# Patient Record
Sex: Male | Born: 1945
Health system: Southern US, Community
[De-identification: ages and names within clinical notes are randomized; demographics above are authoritative.]

## PROBLEM LIST (undated history)

## (undated) DIAGNOSIS — E785 Hyperlipidemia, unspecified: Secondary | ICD-10-CM

## (undated) DIAGNOSIS — G459 Transient cerebral ischemic attack, unspecified: Secondary | ICD-10-CM

## (undated) DIAGNOSIS — C801 Malignant (primary) neoplasm, unspecified: Secondary | ICD-10-CM

## (undated) DIAGNOSIS — Z9889 Other specified postprocedural states: Secondary | ICD-10-CM

## (undated) DIAGNOSIS — K5790 Diverticulosis of intestine, part unspecified, without perforation or abscess without bleeding: Secondary | ICD-10-CM

## (undated) DIAGNOSIS — Z4509 Encounter for adjustment and management of other cardiac device: Secondary | ICD-10-CM

## (undated) DIAGNOSIS — D126 Benign neoplasm of colon, unspecified: Secondary | ICD-10-CM

## (undated) DIAGNOSIS — I998 Other disorder of circulatory system: Secondary | ICD-10-CM

## (undated) HISTORY — DX: Benign neoplasm of colon, unspecified: D12.6

## (undated) HISTORY — DX: Malignant (primary) neoplasm, unspecified: C80.1

## (undated) HISTORY — PX: COLONOSCOPY: SHX174

## (undated) HISTORY — DX: Hyperlipidemia, unspecified: E78.5

## (undated) HISTORY — DX: Diverticulosis of intestine, part unspecified, without perforation or abscess without bleeding: K57.90

## (undated) HISTORY — PX: TONSILLECTOMY: SUR1361

## (undated) HISTORY — DX: Other disorder of circulatory system: I99.8

## (undated) HISTORY — PX: POLYPECTOMY: SHX149

---
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
## 2009-05-02 ENCOUNTER — Encounter (INDEPENDENT_AMBULATORY_CARE_PROVIDER_SITE_OTHER): Payer: Self-pay | Admitting: *Deleted

## 2010-02-21 NOTE — Letter (Signed)
Summary: Colonoscopy Date Change Letter  [facility]  [address] [place], [place] [address]   Phone: [phone]  Fax: [phone]      May 02, 2009 MRN: [id number]   [facility] [address] [place], [place]  [address]   Dear Joseph Pitts,   Previously you were recommended to have a repeat colonoscopy around this time. Your chart was recently reviewed by Dr. Judie Petit T. Russella Dar of [facility]. Follow up colonoscopy is now recommended in April 2014. This revised recommendation is based on current, nationally recognized guidelines for colorectal cancer screening and polyp surveillance. These guidelines are endorsed by the American Cancer Society, [employer] on Colorectal Cancer as well as numerous other major medical organizations.  Please understand that our recommendation assumes that you do not have any new symptoms such as bleeding, a change in bowel habits, anemia, or significant abdominal discomfort. If you do have any concerning GI symptoms or want to discuss the guideline recommendations, please call to arrange an office visit at your earliest convenience. Otherwise we will keep you in our reminder system and contact you 1-2 months prior to the date listed above to schedule your next colonoscopy.  Thank you,  Judie Petit T. Russella Dar, M.D.  [facility] [phone]

## 2011-06-05 ENCOUNTER — Ambulatory Visit: Payer: Self-pay | Admitting: Emergency Medicine

## 2011-07-10 ENCOUNTER — Encounter: Payer: Self-pay | Admitting: Emergency Medicine

## 2011-07-10 ENCOUNTER — Ambulatory Visit (INDEPENDENT_AMBULATORY_CARE_PROVIDER_SITE_OTHER): Payer: Federal, State, Local not specified - PPO | Admitting: Emergency Medicine

## 2011-07-10 VITALS — BP 129/74 | HR 62 | Temp 98.3°F | Resp 16 | Ht 67.0 in | Wt 154.4 lb

## 2011-07-10 DIAGNOSIS — N529 Male erectile dysfunction, unspecified: Secondary | ICD-10-CM

## 2011-07-10 DIAGNOSIS — E785 Hyperlipidemia, unspecified: Secondary | ICD-10-CM

## 2011-07-10 MED ORDER — ROSUVASTATIN CALCIUM 10 MG PO TABS: 10.0000 mg | ORAL_TABLET | Freq: Every day | ORAL | Status: DC

## 2011-07-10 MED ORDER — TADALAFIL 20 MG PO TABS: 20.0000 mg | ORAL_TABLET | ORAL | Status: DC | PRN

## 2011-07-10 NOTE — Progress Notes (Signed)
  Subjective:    Patient ID: Joseph Pitts, male    DOB: 20-Jan-1946, 66 y.o.   MRN: [id number]  HPI patient here to followup his high cholesterol. He currently takes Crestor every other day. Her sizes regularly. he would like to change from Viagra to Cialis for financial reasons    Review of Systems     Objective:   Physical Exam  HEENT exam is unremarkable. Neck supple. Chest clear. Heart regular rate no murmurs to      Assessment & Plan:  Patient with hyperlipidemia. We'll check his lipid panel. Meds were refilled the have changed his Viagra to Cialis .

## 2011-07-11 LAB — COMPREHENSIVE METABOLIC PANEL
AST: 36 U/L (ref 0–37)
Albumin: 4.1 g/dL (ref 3.5–5.2)
BUN: 26 mg/dL — ABNORMAL HIGH (ref 6–23)
Calcium: 9.4 mg/dL (ref 8.4–10.5)
Chloride: 103 mEq/L (ref 96–112)
Glucose, Bld: 98 mg/dL (ref 70–99)
Potassium: 4.5 mEq/L (ref 3.5–5.3)
Sodium: 140 mEq/L (ref 135–145)
Total Protein: 6.5 g/dL (ref 6.0–8.3)

## 2011-07-11 LAB — LIPID PANEL
HDL: 46 mg/dL (ref >39)
LDL Cholesterol: 82 mg/dL (ref 0–99)
Triglycerides: 101 mg/dL (ref <150)

## 2011-12-13 ENCOUNTER — Telehealth: Payer: Self-pay

## 2011-12-13 NOTE — Telephone Encounter (Signed)
Please advise if okay to change to Cialis 5mg  daily? Patient states it is cheaper please advise. Have pended rx

## 2011-12-13 NOTE — Telephone Encounter (Signed)
Will need an ov to change to daily.

## 2011-12-13 NOTE — Telephone Encounter (Signed)
PT REQUESTS CIALIS RX ORIGINIALLY WRITTEN FOR 20MG  TAKING 1/2 TABLET   STATES HE CAN GET THE 5MG  1/2 PRICE HE REQUESTS RX FOR 5MG  2 TABS AS PRESCRIBED

## 2011-12-14 NOTE — Telephone Encounter (Signed)
Spoke with patient and let him know that he will need to come into office for visit before prescription can be changed.  Patient will try and come in this weekend.  He stated that he cuts the 20mg  in half and it is cheaper for him to purchase 5mg  tablets.

## 2011-12-14 NOTE — Telephone Encounter (Signed)
I have sent the patients medication to the pharmacy.  I know when I spoke with him that I told him I needed to talk with Dr Cleta Alberts but after I talked with him I felt like his request was warranted and I sent it to the pharmacy.  I have pended the meds because the pharmacy in the system is [facility] but I think I remember that the patient said he was going to [facility] and I wanted to make sure that the med went to the same pharmacy.

## 2011-12-14 NOTE — Telephone Encounter (Signed)
LMOM to call back

## 2011-12-15 MED ORDER — TADALAFIL 5 MG PO TABS: 10.0000 mg | ORAL_TABLET | Freq: Every day | ORAL | Status: DC | PRN

## 2011-12-15 NOTE — Telephone Encounter (Signed)
patient notified and voiced understanding. He does want them sent to [facility].

## 2012-05-01 ENCOUNTER — Encounter: Payer: Self-pay | Admitting: Gastroenterology

## 2012-07-22 ENCOUNTER — Encounter: Payer: Self-pay | Admitting: Emergency Medicine

## 2012-07-22 ENCOUNTER — Ambulatory Visit (INDEPENDENT_AMBULATORY_CARE_PROVIDER_SITE_OTHER): Payer: Federal, State, Local not specified - PPO | Admitting: Emergency Medicine

## 2012-07-22 VITALS — BP 130/82 | HR 58 | Temp 98.1°F | Resp 16 | Ht 67.0 in | Wt 154.0 lb

## 2012-07-22 DIAGNOSIS — E785 Hyperlipidemia, unspecified: Secondary | ICD-10-CM

## 2012-07-22 DIAGNOSIS — N529 Male erectile dysfunction, unspecified: Secondary | ICD-10-CM

## 2012-07-22 LAB — LIPID PANEL
Cholesterol: 159 mg/dL (ref 0–200)
HDL: 55 mg/dL (ref >39)
Total CHOL/HDL Ratio: 2.9 Ratio
VLDL: 14 mg/dL (ref 0–40)

## 2012-07-22 NOTE — Progress Notes (Signed)
  Subjective:    Patient ID: Joseph Pitts, male    DOB: [birth year], 67 y.o.   MRN: [id number]  HPI patient enters for followup. He recently broke up with his girlfriend and this has been an issue. He has recently signed on to online dating. He continues to take his Crestor and takes it 3 days a week. He does have a significant family history of coronary artery disease. He does not have any history of chest pain shortness of breath or any other symptoms at the present time. He does have some urinary problems and follows up with the urologist for this.    Review of Systems     Objective:   Physical Exam HEENT exam is unremarkable. 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 I encouraged him to pursue online dating. Lipid panel was done today to followup on his hyperlipidemia

## 2012-08-14 ENCOUNTER — Other Ambulatory Visit: Payer: Self-pay | Admitting: Emergency Medicine

## 2012-12-24 ENCOUNTER — Telehealth: Payer: Self-pay | Admitting: Radiology

## 2012-12-24 NOTE — Telephone Encounter (Signed)
error 

## 2013-02-03 ENCOUNTER — Ambulatory Visit (INDEPENDENT_AMBULATORY_CARE_PROVIDER_SITE_OTHER): Payer: Federal, State, Local not specified - PPO | Admitting: Emergency Medicine

## 2013-02-03 ENCOUNTER — Encounter: Payer: Self-pay | Admitting: Emergency Medicine

## 2013-02-03 DIAGNOSIS — Z0000 Encounter for general adult medical examination without abnormal findings: Secondary | ICD-10-CM

## 2013-02-03 DIAGNOSIS — N529 Male erectile dysfunction, unspecified: Secondary | ICD-10-CM

## 2013-02-03 DIAGNOSIS — Z1211 Encounter for screening for malignant neoplasm of colon: Secondary | ICD-10-CM

## 2013-02-03 DIAGNOSIS — Z125 Encounter for screening for malignant neoplasm of prostate: Secondary | ICD-10-CM

## 2013-02-03 DIAGNOSIS — H3560 Retinal hemorrhage, unspecified eye: Secondary | ICD-10-CM

## 2013-02-03 DIAGNOSIS — H3562 Retinal hemorrhage, left eye: Secondary | ICD-10-CM | POA: Insufficient documentation

## 2013-02-03 DIAGNOSIS — I440 Atrioventricular block, first degree: Secondary | ICD-10-CM

## 2013-02-03 DIAGNOSIS — E785 Hyperlipidemia, unspecified: Secondary | ICD-10-CM

## 2013-02-03 DIAGNOSIS — K4090 Unilateral inguinal hernia, without obstruction or gangrene, not specified as recurrent: Secondary | ICD-10-CM

## 2013-02-03 LAB — COMPLETE METABOLIC PANEL WITH GFR
ALK PHOS: 102 U/L (ref 39–117)
ALT: 14 U/L (ref 0–53)
AST: 23 U/L (ref 0–37)
Albumin: 4.3 g/dL (ref 3.5–5.2)
BILIRUBIN TOTAL: 0.7 mg/dL (ref 0.3–1.2)
BUN: 26 mg/dL — ABNORMAL HIGH (ref 6–23)
CO2: 29 mEq/L (ref 19–32)
Calcium: 9.3 mg/dL (ref 8.4–10.5)
Chloride: 101 mEq/L (ref 96–112)
Creat: 0.99 mg/dL (ref 0.50–1.35)
GFR, EST NON AFRICAN AMERICAN: 79 mL/min
GFR, Est African American: >89 mL/min
Glucose, Bld: 93 mg/dL (ref 70–99)
POTASSIUM: 4.2 meq/L (ref 3.5–5.3)
SODIUM: 138 meq/L (ref 135–145)
TOTAL PROTEIN: 6.5 g/dL (ref 6.0–8.3)

## 2013-02-03 LAB — CBC WITH DIFFERENTIAL/PLATELET
BASOS ABS: 0 10*3/uL (ref 0.0–0.1)
BASOS PCT: 0 % (ref 0–1)
Eosinophils Absolute: 0.1 10*3/uL (ref 0.0–0.7)
Eosinophils Relative: 2 % (ref 0–5)
HCT: 46.7 % (ref 39.0–52.0)
Hemoglobin: 16.2 g/dL (ref 13.0–17.0)
Lymphocytes Relative: 29 % (ref 12–46)
Lymphs Abs: 2.1 10*3/uL (ref 0.7–4.0)
MCH: 31.1 pg (ref 26.0–34.0)
MCHC: 34.7 g/dL (ref 30.0–36.0)
MCV: 89.6 fL (ref 78.0–100.0)
Monocytes Absolute: 0.5 10*3/uL (ref 0.1–1.0)
Monocytes Relative: 7 % (ref 3–12)
NEUTROS ABS: 4.6 10*3/uL (ref 1.7–7.7)
NEUTROS PCT: 62 % (ref 43–77)
PLATELETS: 285 10*3/uL (ref 150–400)
RBC: 5.21 MIL/uL (ref 4.22–5.81)
RDW: 13.2 % (ref 11.5–15.5)
WBC: 7.3 10*3/uL (ref 4.0–10.5)

## 2013-02-03 LAB — POCT URINALYSIS DIPSTICK
BILIRUBIN UA: NEGATIVE
Blood, UA: NEGATIVE
Glucose, UA: NEGATIVE
Ketones, UA: NEGATIVE
Leukocytes, UA: NEGATIVE
Nitrite, UA: NEGATIVE
PH UA: 5.5
Protein, UA: NEGATIVE
Spec Grav, UA: >=1.03
Urobilinogen, UA: 0.2

## 2013-02-03 LAB — LIPID PANEL
Cholesterol: 204 mg/dL — ABNORMAL HIGH (ref 0–200)
HDL: 49 mg/dL (ref >39)
LDL CALC: 136 mg/dL — AB (ref 0–99)
Total CHOL/HDL Ratio: 4.2 Ratio
Triglycerides: 94 mg/dL (ref <150)
VLDL: 19 mg/dL (ref 0–40)

## 2013-02-03 MED ORDER — ROSUVASTATIN CALCIUM 10 MG PO TABS: ORAL_TABLET | ORAL | Status: DC

## 2013-02-03 MED ORDER — TADALAFIL 5 MG PO TABS: 10.0000 mg | ORAL_TABLET | Freq: Every day | ORAL | Status: DC | PRN

## 2013-02-03 NOTE — Progress Notes (Deleted)
   Subjective:    Patient ID: Joseph Pitts, male    DOB: [birth year], 68 y.o.   MRN: [id number]  HPI    Review of Systems  Constitutional: Negative.   HENT: Negative.   Eyes: Positive for visual disturbance.  Respiratory: Negative.   Cardiovascular: Negative.   Gastrointestinal: Negative.   Endocrine: Negative.   Genitourinary: Negative.   Musculoskeletal: Negative.   Skin: Negative.   Allergic/Immunologic: Negative.   Neurological: Negative.   Hematological: Negative.   Psychiatric/Behavioral: Negative.        Objective:   Physical Exam        Assessment & Plan:

## 2013-02-03 NOTE — Progress Notes (Signed)
Subjective:    Patient ID: Joseph Pitts, male    DOB: Jun 11, 1945, 68 y.o.   MRN: [id number]  HPI This chart was scribed for Remo Lipps Kaylaann Mountz-MD, by Lovena Le Day, Scribe. This patient was seen in room 22 and the patient's care was started at 8:38 AM.  HPI Comments: Joseph Pitts is a 68 y.o. male who presents to the [facility] for annual physical exam. He reports has been exercising regularly and has been doing so for many years, he usually walks for exercise. Not taking Crestor medicine for the past 3 weeks. He was taking it 3 days a week. He takes Cialis when he needs it. He takes ASA almost every day.  He had a problem arise lately w/his left retina and gets regular shots for this problem. He states has had 2 shots so far which have seemed to help.  He also states has 2 hernias and notices them when he is working out at [employer] but states that they do not bother him. He recently saw the urologist and needs PSA sent to the urologist.  He has had the flu shot, shingles and PNA this year. His tetanus is also UTD.   Patient Active Problem List   Diagnosis Date Noted  . Other and unspecified hyperlipidemia 07/22/2012  . Erectile dysfunction 07/22/2012   History reviewed. No pertinent past surgical history.  Family History  Problem Relation Age of Onset  . Heart disease Mother   . Cancer Father     History   Social History  . Marital Status: Married    Spouse Name: N/A    Number of Children: N/A  . Years of Education: N/A   Occupational History  . Not on file.   Social History Main Topics  . Smoking status: Former Research scientist (life sciences)  . Smokeless tobacco: Not on file  . Alcohol Use: Yes  . Drug Use: No  . Sexual Activity: Not on file   Other Topics Concern  . Not on file   Social History Narrative  . No narrative on file   Allergies  Allergen Reactions  . Sulfa Antibiotics     unknown   Results for orders placed in visit on 07/22/12  LIPID PANEL   Result Value Range   Cholesterol 159  0 - 200 mg/dL   Triglycerides 69  <150 mg/dL   HDL 55  >39 mg/dL   Total CHOL/HDL Ratio 2.9     VLDL 14  0 - 40 mg/dL   LDL Cholesterol 90  0 - 99 mg/dL   Review of Systems  Constitutional: Negative for fever and chills.  Respiratory: Negative for shortness of breath.   Cardiovascular: Negative for chest pain.  Gastrointestinal: Negative for abdominal pain.  Musculoskeletal: Negative for back pain.      Objective:   Physical Exam Physical Exam  Nursing note and vitals reviewed. Constitutional: Patient is oriented to person, place, and time. Patient appears well-developed and well-nourished. No distress.  HENT: Moist mucous membranes, posterior oropharynx is clear Head: Normocephalic and atraumatic.  Neck: Neck supple. No tracheal deviation present.  Cardiovascular: Normal rate, regular rhythm and normal heart sounds. Normal DP pulses bilaterally. No murmur heard. Pulmonary/Chest: Effort normal and breath sounds normal. No respiratory distress. Patient has no wheezes. Patient has no rales.  Musculoskeletal: Normal range of motion. Small right inguinal hernia that is reducible. Large left inguinal hernia that is reducible.  Neurological: Patient is alert and oriented to person, place,  and time.  Skin: Skin is warm and dry.  Psychiatric: Patient has a normal mood and affect. Patient's behavior is normal.  ABD patient has bilateral inguinal hernias Triage Vitals: There were no vitals taken for this visit.  COORDINATION OF CARE: At 831 AM Discussed treatment plan with patient which includes referral for colonoscopy. Patient agrees.      Assessment & Plan:  Patient is to continue his current medications. He is to decide if he wants to stay on Crestor. He's been off of that for the last 3 weeks. He will discuss whether he wants to be on baby aspirin with his eyes surgeon Dr.Rankin he will continue Cialis 2 tablets prior to intercourse. He is going  to make his own appointment to see the GI specialist for colonoscopy. He will make his own appointment to see a surgeon about repair of his inguinal hernias.

## 2013-02-04 LAB — PSA, MEDICARE: PSA: 3.38 ng/mL (ref <=4.00)

## 2013-02-05 ENCOUNTER — Telehealth: Payer: Self-pay | Admitting: *Deleted

## 2013-02-05 NOTE — Telephone Encounter (Signed)
Dr Everlene Farrier spoke to pt about PSA lab results. Pt is in a new relationship and is very sexually active. Pt agrees to rtc in 2 months for a recheck.

## 2013-02-25 ENCOUNTER — Telehealth: Payer: Self-pay

## 2013-03-02 ENCOUNTER — Ambulatory Visit (INDEPENDENT_AMBULATORY_CARE_PROVIDER_SITE_OTHER): Payer: Federal, State, Local not specified - PPO | Admitting: Emergency Medicine

## 2013-03-02 VITALS — BP 122/78 | HR 58 | Temp 98.0°F | Resp 17 | Ht 67.5 in | Wt 163.0 lb

## 2013-03-02 DIAGNOSIS — Z202 Contact with and (suspected) exposure to infections with a predominantly sexual mode of transmission: Secondary | ICD-10-CM

## 2013-03-02 LAB — HEPATITIS C ANTIBODY: HCV Ab: NEGATIVE

## 2013-03-02 LAB — HIV ANTIBODY (ROUTINE TESTING W REFLEX): HIV: NONREACTIVE

## 2013-03-02 NOTE — Progress Notes (Signed)
   Subjective:    Patient ID: Joseph Pitts, male    DOB: [birth year], 68 y.o.   MRN: [id number] This chart was scribed for Darlyne Russian, MD by Anastasia Pall, ED Scribe. This patient was seen in room 02 and the patient's care was started at 9:30 AM.  Chief Complaint  Patient presents with  . patient is requesting a std blood test and A PSA    HPI Joseph Pitts is a 68 y.o. male He presents to the [facility] for STD blood check and A PSA, per dating his partner and being sexually active.  He denies urinary and GU symptoms and any other symptoms.   PCP - No PCP Per Patient  Patient Active Problem List   Diagnosis Date Noted  . Inguinal hernia 02/03/2013  . First degree heart block 02/03/2013  . Retinal hemorrhage of left eye 02/03/2013  . Other and unspecified hyperlipidemia 07/22/2012  . Erectile dysfunction 07/22/2012   No past medical history on file. No past surgical history on file. Allergies  Allergen Reactions  . Sulfa Antibiotics     unknown   Prior to Admission medications   Medication Sig Start Date End Date Taking? Authorizing Provider  aspirin 81 MG tablet Take 81 mg by mouth daily.   Yes Historical Provider, MD  Multiple Vitamin (MULTIVITAMIN) tablet Take 1 tablet by mouth daily.   Yes Historical Provider, MD  rosuvastatin (CRESTOR) 10 MG tablet TAKE 1 TABLET (10 MG TOTAL) BY MOUTH DAILY. 02/03/13  Yes Darlyne Russian, MD  tadalafil (CIALIS) 5 MG tablet Take 2 tablets (10 mg total) by mouth daily as needed for erectile dysfunction. 02/03/13  Yes Darlyne Russian, MD    Review of Systems  Gastrointestinal: Negative.   Genitourinary: Negative.   Skin: Negative.   All other systems reviewed and are negative.      Objective:   Physical Exam Nursing note and vitals reviewed. Constitutional: Pt is oriented to person, place, and time. Pt appears well-developed and well-nourished. No distress.  HENT:  Head: Normocephalic and atraumatic.  Eyes: EOM are normal. Pupils  are equal, round, and reactive to light.  Neck: Neck supple. No tracheal deviation present.  Cardiovascular: Normal rate. Pulmonary/Chest: Effort normal. Abdominal:  Musculoskeletal: Normal range of motion.  Neurological: Pt is alert and oriented to person, place, and time.  Skin: Skin is warm and dry.  Psychiatric: Pt has a normal mood and affect. Pt's behavior is normal.   BP 122/78  Pulse 58  Temp(Src) 98 F (36.7 C) (Oral)  Resp 17  Ht 5' 7.5" (1.715 m)  Wt 163 lb (73.936 kg)  BMI 25.14 kg/m2  SpO2 97%      Assessment & Plan:   Patient's exam is normal. He is in a new relationship. STD testing will be performed    I personally performed the services described in this documentation, which was scribed in my presence. The recorded information has been reviewed and is accurate.

## 2013-03-03 LAB — GC/CHLAMYDIA PROBE AMP
CT Probe RNA: NEGATIVE
GC Probe RNA: NEGATIVE

## 2013-03-12 ENCOUNTER — Telehealth: Payer: Self-pay

## 2013-03-12 DIAGNOSIS — R972 Elevated prostate specific antigen [PSA]: Secondary | ICD-10-CM

## 2013-03-12 DIAGNOSIS — Z113 Encounter for screening for infections with a predominantly sexual mode of transmission: Secondary | ICD-10-CM

## 2013-03-12 NOTE — Telephone Encounter (Signed)
Pt notified and future orders placed.

## 2013-03-12 NOTE — Telephone Encounter (Signed)
I am not sure why they were canceled. Please put in a future orders and at that time draw a PSA to followup on an elevated PSA and add an HSV 1 and 2 IgG

## 2013-03-12 NOTE — Telephone Encounter (Signed)
Dr. Everlene Farrier, pt wants to know when he should have his PSA checked again. Also it looks like we were going to do a HSV test on him but all of the ones in [facility] were cancelled. Do you know anything about that? Please advise. Thanks

## 2013-03-12 NOTE — Telephone Encounter (Signed)
Patient states he never received the results of his STD/PSA exam.  [phone] (ok to leave VM)

## 2013-03-26 ENCOUNTER — Other Ambulatory Visit (INDEPENDENT_AMBULATORY_CARE_PROVIDER_SITE_OTHER): Payer: Federal, State, Local not specified - PPO

## 2013-03-26 DIAGNOSIS — R972 Elevated prostate specific antigen [PSA]: Secondary | ICD-10-CM

## 2013-03-26 DIAGNOSIS — Z113 Encounter for screening for infections with a predominantly sexual mode of transmission: Secondary | ICD-10-CM

## 2013-03-26 LAB — PSA: PSA: 2.11 ng/mL (ref <=4.00)

## 2013-03-26 NOTE — Progress Notes (Unsigned)
Patient here for labs only. 

## 2013-03-27 LAB — HSV(HERPES SIMPLEX VRS) I + II AB-IGG
HSV 1 GLYCOPROTEIN G AB, IGG: 0.08 IV
HSV 2 Glycoprotein G Ab, IgG: 5.24 IV — ABNORMAL HIGH

## 2013-05-01 ENCOUNTER — Telehealth: Payer: Self-pay

## 2013-05-01 NOTE — Telephone Encounter (Signed)
PA denied for cialis because pt's plan does not cover any medications for the tx of ED. Notified pharm.

## 2013-09-19 ENCOUNTER — Ambulatory Visit: Payer: Self-pay

## 2013-09-19 ENCOUNTER — Ambulatory Visit (INDEPENDENT_AMBULATORY_CARE_PROVIDER_SITE_OTHER): Payer: Federal, State, Local not specified - PPO

## 2013-09-19 ENCOUNTER — Ambulatory Visit (INDEPENDENT_AMBULATORY_CARE_PROVIDER_SITE_OTHER): Payer: Federal, State, Local not specified - PPO | Admitting: Family Medicine

## 2013-09-19 VITALS — BP 136/82 | HR 59 | Temp 98.0°F | Resp 15 | Ht 66.5 in | Wt 157.4 lb

## 2013-09-19 DIAGNOSIS — IMO0002 Reserved for concepts with insufficient information to code with codable children: Secondary | ICD-10-CM

## 2013-09-19 DIAGNOSIS — M25519 Pain in unspecified shoulder: Secondary | ICD-10-CM

## 2013-09-19 DIAGNOSIS — S9030XA Contusion of unspecified foot, initial encounter: Secondary | ICD-10-CM

## 2013-09-19 DIAGNOSIS — S9032XA Contusion of left foot, initial encounter: Secondary | ICD-10-CM

## 2013-09-19 DIAGNOSIS — S60512A Abrasion of left hand, initial encounter: Secondary | ICD-10-CM

## 2013-09-19 DIAGNOSIS — M25512 Pain in left shoulder: Secondary | ICD-10-CM
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
## 2013-09-19 NOTE — Patient Instructions (Signed)
Shoulder Separation You have a shoulder separation (acromioclavicular separation). This occurs when an injury stretches or tears the ligaments that connect the top of the shoulder blade to the collar bone. Injury causes these bones to separate. A sling or splint may be applied to limit your range of motion. HOME CARE INSTRUCTIONS   Apply ice to the injury for 15-20 minutes each hour while awake for the first 2 days. Put the ice in a plastic bag. Place a towel between the bag of ice and your skin.  Avoid activities that increase pain.  Wear your sling or splint for as long as directed by your caregiver. If you remove the sling to dress or bathe, be sure your arm remains in the same position as when the sling is on. Do not lift your arm.  The splint must be tightened by another person every day. Tighten it enough to keep the shoulders held back. Allow enough room to place the index finger between the body and strap. Loosen the splint immediately if you feel numbness or tingling in your hands.  Only take over-the-counter or prescription medicines for pain, discomfort, or fever as directed by your caregiver.  If this is a severe injury, you may need a referral to a specialist. It is very important to keep any follow-up appointments in order to avoid any type of permanent shoulder disability or chronic pain problems. SEEK MEDICAL CARE IF:  Pain or swelling to the injury site increases and is not relieved with medications. SEEK IMMEDIATE MEDICAL CARE IF:  Your arm becomes numb, pale, cold, or there are abnormal feelings (sensations) shooting into your fingertips. Document Released: 10/18/2004 Document Revised: 05/25/2013 Document Reviewed: 08/20/2006 [facility] Patient Information 2015 [place], [place]. This information is not intended to replace advice given to you by your health care provider. Make sure you discuss any questions you have with your health care provider.

## 2013-09-19 NOTE — Addendum Note (Signed)
Addended by: Gustavus Bryant on: 09/19/2013 01:43 PM   Modules accepted: Orders

## 2013-10-29 ENCOUNTER — Ambulatory Visit (INDEPENDENT_AMBULATORY_CARE_PROVIDER_SITE_OTHER): Payer: BC Managed Care – PPO | Admitting: Sports Medicine

## 2013-10-29 ENCOUNTER — Encounter: Payer: Self-pay | Admitting: Sports Medicine

## 2013-10-29 VITALS — BP 128/83 | HR 67 | Ht 68.0 in | Wt 155.0 lb

## 2013-10-29 DIAGNOSIS — M25512 Pain in left shoulder: Secondary | ICD-10-CM | POA: Diagnosis not present

## 2013-10-29 NOTE — Assessment & Plan Note (Addendum)
A.c. joint separation observed on films and ultrasound. Pain most likely associated with this but also could be caused by spur observed in supraspinatus on ultrasound. - Given exercises.  - Encourage lots of repetitions with low weights  - 6 Weeks before doing any overhead exercises - RTC at 6 weeks when necessary; that time can rescan the a.c. joint to see if the effusion has resolved
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
## 2013-12-10 ENCOUNTER — Ambulatory Visit: Payer: BC Managed Care – PPO | Admitting: Sports Medicine

## 2013-12-29 ENCOUNTER — Telehealth: Payer: Self-pay | Admitting: Family Medicine

## 2013-12-29 NOTE — Telephone Encounter (Signed)
Patient received his flu shot at work on 11/05/13

## 2014-01-22 DIAGNOSIS — D126 Benign neoplasm of colon, unspecified: Secondary | ICD-10-CM

## 2014-01-22 HISTORY — DX: Benign neoplasm of colon, unspecified: D12.6

## 2014-02-09 ENCOUNTER — Ambulatory Visit (INDEPENDENT_AMBULATORY_CARE_PROVIDER_SITE_OTHER): Payer: Federal, State, Local not specified - PPO | Admitting: Emergency Medicine

## 2014-02-09 ENCOUNTER — Encounter: Payer: Self-pay | Admitting: Emergency Medicine

## 2014-02-09 VITALS — BP 120/80 | HR 54 | Temp 97.9°F | Resp 16 | Ht 67.75 in | Wt 156.8 lb

## 2014-02-09 DIAGNOSIS — R0982 Postnasal drip: Secondary | ICD-10-CM

## 2014-02-09 DIAGNOSIS — Z0000 Encounter for general adult medical examination without abnormal findings: Secondary | ICD-10-CM

## 2014-02-09 DIAGNOSIS — Z23 Encounter for immunization: Secondary | ICD-10-CM

## 2014-02-09 DIAGNOSIS — Z125 Encounter for screening for malignant neoplasm of prostate: Secondary | ICD-10-CM

## 2014-02-09 DIAGNOSIS — Z136 Encounter for screening for cardiovascular disorders: Secondary | ICD-10-CM

## 2014-02-09 DIAGNOSIS — E785 Hyperlipidemia, unspecified: Secondary | ICD-10-CM

## 2014-02-09 DIAGNOSIS — Z1159 Encounter for screening for other viral diseases: Secondary | ICD-10-CM

## 2014-02-09 DIAGNOSIS — Z1211 Encounter for screening for malignant neoplasm of colon: Secondary | ICD-10-CM

## 2014-02-09 DIAGNOSIS — I440 Atrioventricular block, first degree: Secondary | ICD-10-CM

## 2014-02-09 LAB — POCT URINALYSIS DIPSTICK
BILIRUBIN UA: NEGATIVE
Blood, UA: NEGATIVE
Glucose, UA: NEGATIVE
KETONES UA: NEGATIVE
Leukocytes, UA: NEGATIVE
Nitrite, UA: NEGATIVE
Protein, UA: NEGATIVE
SPEC GRAV UA: 1.015
Urobilinogen, UA: 0.2
pH, UA: 6.5

## 2014-02-09 LAB — COMPLETE METABOLIC PANEL WITH GFR
ALBUMIN: 4.2 g/dL (ref 3.5–5.2)
ALT: 16 U/L (ref 0–53)
AST: 26 U/L (ref 0–37)
Alkaline Phosphatase: 94 U/L (ref 39–117)
BUN: 21 mg/dL (ref 6–23)
CHLORIDE: 102 meq/L (ref 96–112)
CO2: 27 mEq/L (ref 19–32)
Calcium: 9.2 mg/dL (ref 8.4–10.5)
Creat: 1.04 mg/dL (ref 0.50–1.35)
GFR, Est African American: 85 mL/min
GFR, Est Non African American: 74 mL/min
GLUCOSE: 91 mg/dL (ref 70–99)
POTASSIUM: 4.5 meq/L (ref 3.5–5.3)
Sodium: 138 mEq/L (ref 135–145)
TOTAL PROTEIN: 6.6 g/dL (ref 6.0–8.3)
Total Bilirubin: 0.6 mg/dL (ref 0.2–1.2)

## 2014-02-09 LAB — LIPID PANEL
Cholesterol: 182 mg/dL (ref 0–200)
HDL: 53 mg/dL (ref >39)
LDL Cholesterol: 115 mg/dL — ABNORMAL HIGH (ref 0–99)
TRIGLYCERIDES: 69 mg/dL (ref <150)
Total CHOL/HDL Ratio: 3.4 Ratio
VLDL: 14 mg/dL (ref 0–40)

## 2014-02-09 LAB — CBC WITH DIFFERENTIAL/PLATELET
BASOS PCT: 1 % (ref 0–1)
Basophils Absolute: 0.1 10*3/uL (ref 0.0–0.1)
EOS PCT: 2 % (ref 0–5)
Eosinophils Absolute: 0.1 10*3/uL (ref 0.0–0.7)
HCT: 40.2 % (ref 39.0–52.0)
Hemoglobin: 13.8 g/dL (ref 13.0–17.0)
Lymphocytes Relative: 34 % (ref 12–46)
Lymphs Abs: 1.9 10*3/uL (ref 0.7–4.0)
MCH: 30 pg (ref 26.0–34.0)
MCHC: 34.3 g/dL (ref 30.0–36.0)
MCV: 87.4 fL (ref 78.0–100.0)
MONO ABS: 0.3 10*3/uL (ref 0.1–1.0)
MONOS PCT: 6 % (ref 3–12)
MPV: 9.6 fL (ref 8.6–12.4)
Neutro Abs: 3.2 10*3/uL (ref 1.7–7.7)
Neutrophils Relative %: 57 % (ref 43–77)
Platelets: 239 10*3/uL (ref 150–400)
RBC: 4.6 MIL/uL (ref 4.22–5.81)
RDW: 13.6 % (ref 11.5–15.5)
WBC: 5.6 10*3/uL (ref 4.0–10.5)

## 2014-02-09 LAB — HEPATITIS C ANTIBODY: HCV Ab: NEGATIVE

## 2014-02-09 MED ORDER — ATORVASTATIN CALCIUM 40 MG PO TABS: 40.0000 mg | ORAL_TABLET | Freq: Every day | ORAL | Status: DC

## 2014-02-09 NOTE — Progress Notes (Signed)
Subjective:  This chart was scribed for Arlyss Queen, MD by Donato Schultz, Medical Scribe. This patient was seen in Room 21 and the patient's care was started at 8:50 AM.   Patient ID: Joseph Pitts, male    DOB: 04/08/1945, 69 y.o.   MRN: [id number]  HPI HPI Comments: Joseph Pitts is a 69 y.o. male who presents to the [facility] for an annual exam.  He is complaining of rhinorrhea and hoarseness while eating and brushing his teeth.  He does not experience heartburn or gas after eating.  He was prescribed a nasal spray at his last visit to be used for his symptoms and experienced no relief.  Nothing sticks in his throat when he eats.    He would like to change his Crestor prescription due to a change in his insurance.  He has not taken any other blood pressure medications.  He takes Crestor three times weekly.    He is also wanting an abdominal aortic aneurysm screening.  He smoked from the time he was 69 years old to the time he was 24 years told and then for a year in the 58s.  He does not have a family history of abdominal aortic aneurysm.  He has not received a colonoscopy lately.  His GI doctor is Dr. Fuller Plan.  Patient Active Problem List   Diagnosis Date Noted  . Left shoulder pain 10/29/2013  . Inguinal hernia 02/03/2013  . First degree heart block 02/03/2013  . Retinal hemorrhage of left eye 02/03/2013  . Other and unspecified hyperlipidemia 07/22/2012  . Erectile dysfunction 07/22/2012   No past medical history on file. No past surgical history on file. Allergies  Allergen Reactions  . Sulfa Antibiotics     unknown   Prior to Admission medications   Medication Sig Start Date End Date Taking? Authorizing Provider  aspirin 81 MG tablet Take 81 mg by mouth daily.    Historical Provider, MD  ibuprofen (ADVIL,MOTRIN) 800 MG tablet Take 800 mg by mouth every 8 (eight) hours as needed.    Historical Provider, MD  Multiple Vitamin (MULTIVITAMIN) tablet  Take 1 tablet by mouth daily.    Historical Provider, MD  Ranibizumab (LUCENTIS IO) Inject into the eye. Every 6 weeks, last dose 08/12/13, unsure of dose    Historical Provider, MD  rosuvastatin (CRESTOR) 10 MG tablet TAKE 1 TABLET (10 MG TOTAL) BY MOUTH DAILY. 02/03/13   Darlyne Russian, MD  tadalafil (CIALIS) 5 MG tablet Take 2 tablets (10 mg total) by mouth daily as needed for erectile dysfunction. 02/03/13   Darlyne Russian, MD   History   Social History  . Marital Status: Divorced    Spouse Name: N/A    Number of Children: N/A  . Years of Education: N/A   Occupational History  . Not on file.   Social History Main Topics  . Smoking status: Former Research scientist (life sciences)  . Smokeless tobacco: Not on file  . Alcohol Use: Yes  . Drug Use: No  . Sexual Activity: Not on file   Other Topics Concern  . Not on file   Social History Narrative     Review of Systems  HENT: Positive for postnasal drip, rhinorrhea and voice change.   All other systems reviewed and are negative.   Objective:  Physical Exam  Constitutional: He is oriented to person, place, and time. He appears well-developed and well-nourished.  HENT:  Head: Normocephalic and atraumatic.  Right  Ear: External ear normal.  Left Ear: External ear normal.  Mouth/Throat: Oropharynx is clear and moist. No oropharyngeal exudate.  Eyes: Conjunctivae and EOM are normal. Pupils are equal, round, and reactive to light. Right eye exhibits no discharge. Left eye exhibits no discharge. No scleral icterus.  Neck: Normal range of motion. Neck supple. No thyromegaly present.  Cardiovascular: Normal rate, regular rhythm and normal heart sounds.  Exam reveals no gallop and no friction rub.   No murmur heard. Pulmonary/Chest: Effort normal and breath sounds normal. No respiratory distress. He has no wheezes. He has no rales. He exhibits no tenderness.  Pectus deformity in the chest.  Abdominal: Soft. There is no tenderness.  Genitourinary:  Patient  due to see urologist in 2 weeks.  Rectal deferred.   Musculoskeletal: Normal range of motion.  Lymphadenopathy:    He has no cervical adenopathy.  Neurological: He is alert and oriented to person, place, and time.  Skin: Skin is warm and dry.  Psychiatric: He has a normal mood and affect. His behavior is normal.  Nursing note and vitals reviewed.  EKG shows sinus bradycardia with first-degree heart block.  BP 120/80 mmHg  Pulse 54  Temp(Src) 97.9 F (36.6 C) (Oral)  Resp 16  Ht 5' 7.75" (1.721 m)  Wt 156 lb 12.8 oz (71.124 kg)  BMI 24.01 kg/m2  SpO2 99% Assessment & Plan:  Patient has symptoms of post-nasal drip.  Referral to ENT.  Patient over 62 with a history of smoking.  Will refer for screening for abdominal aortic aneurysm.  He is due to see the urologist in 2 weeks so his exam was deferred. He will take home a heme assure. We'll treat with Lipitor 40 mg 3 times a week as a change from Crestor because of insurance issues. Referral made back to Dr. Fuller Plan so he can have his GI screening. I personally performed the services described in this documentation, which was scribed in my presence. The recorded information has been reviewed and is accurate.

## 2014-02-09 NOTE — Progress Notes (Signed)
Subjective:  This chart was scribed for Joseph Queen, MD by Donato Schultz, Medical Scribe. This patient was seen in Room 21 and the patient's care was started at 8:50 AM.   Patient ID: Ok Joseph Pitts, male    DOB: 04-21-1945, 69 y.o.   MRN: [id number]  HPI HPI Comments: Joseph Pitts is a 69 y.o. male who presents to the [facility] for an annual exam.  He is complaining of rhinorrhea and hoarseness while eating and brushing his teeth.  He does not experience heartburn or gas after eating.  He was prescribed a nasal spray at his last visit to be used for his symptoms and experienced no relief.  Nothing sticks in his throat when he eats.    He would like to change his Crestor prescription due to a change in his insurance.  He has not taken any other blood pressure medications.  He takes Crestor three times weekly.    He is also wanting an abdominal aortic aneurysm screening.  He smoked from the time he was 69 years old to the time he was 24 years told and then for a year in the 30s.  He does not have a family history of abdominal aortic aneurysm.  He has not received a colonoscopy lately.  His GI doctor is Dr. Fuller Plan.  Patient Active Problem List   Diagnosis Date Noted   Left shoulder pain 10/29/2013   Inguinal hernia 02/03/2013   First degree heart block 02/03/2013   Retinal hemorrhage of left eye 02/03/2013   Other and unspecified hyperlipidemia 07/22/2012   Erectile dysfunction 07/22/2012   No past medical history on file. No past surgical history on file. Allergies  Allergen Reactions   Sulfa Antibiotics     unknown   Prior to Admission medications   Medication Sig Start Date End Date Taking? Authorizing Provider  aspirin 81 MG tablet Take 81 mg by mouth daily.    Historical Provider, MD  ibuprofen (ADVIL,MOTRIN) 800 MG tablet Take 800 mg by mouth every 8 (eight) hours as needed.    Historical Provider, MD  Multiple Vitamin (MULTIVITAMIN) tablet  Take 1 tablet by mouth daily.    Historical Provider, MD  Ranibizumab (LUCENTIS IO) Inject into the eye. Every 6 weeks, last dose 08/12/13, unsure of dose    Historical Provider, MD  rosuvastatin (CRESTOR) 10 MG tablet TAKE 1 TABLET (10 MG TOTAL) BY MOUTH DAILY. 02/03/13   Darlyne Russian, MD  tadalafil (CIALIS) 5 MG tablet Take 2 tablets (10 mg total) by mouth daily as needed for erectile dysfunction. 02/03/13   Darlyne Russian, MD   History   Social History   Marital Status: Divorced    Spouse Name: N/A    Number of Children: N/A   Years of Education: N/A   Occupational History   Not on file.   Social History Main Topics   Smoking status: Former Smoker   Smokeless tobacco: Not on file   Alcohol Use: Yes   Drug Use: No   Sexual Activity: Not on file   Other Topics Concern   Not on file   Social History Narrative   No narrative on file     Review of Systems  HENT: Positive for postnasal drip, rhinorrhea and voice change.   All other systems reviewed and are negative.   Objective:  Physical Exam  Constitutional: He is oriented to person, place, and time. He appears well-developed and well-nourished.  HENT:  Head: Normocephalic and atraumatic.  Right Ear: External ear normal.  Left Ear: External ear normal.  Mouth/Throat: Oropharynx is clear and moist. No oropharyngeal exudate.  Eyes: Conjunctivae and EOM are normal. Pupils are equal, round, and reactive to light. Right eye exhibits no discharge. Left eye exhibits no discharge. No scleral icterus.  Neck: Normal range of motion. Neck supple. No thyromegaly present.  Cardiovascular: Normal rate, regular rhythm and normal heart sounds.  Exam reveals no gallop and no friction rub.   No murmur heard. Pulmonary/Chest: Effort normal and breath sounds normal. No respiratory distress. He has no wheezes. He has no rales. He exhibits no tenderness.  Pectus deformity in the chest.  Abdominal: Soft. There is no tenderness.    Genitourinary:  Patient due to see urologist in 2 weeks.  Rectal deferred.   Musculoskeletal: Normal range of motion.  Lymphadenopathy:    He has no cervical adenopathy.  Neurological: He is alert and oriented to person, place, and time.  Skin: Skin is warm and dry.  Psychiatric: He has a normal mood and affect. His behavior is normal.  Nursing note and vitals reviewed.    BP 120/80 mmHg   Pulse 54   Temp(Src) 97.9 F (36.6 C) (Oral)   Resp 16   Ht 5' 7.75" (1.721 m)   Wt 156 lb 12.8 oz (71.124 kg)   BMI 24.01 kg/m2   SpO2 99% Assessment & Plan:  Patient has symptoms of post-nasal drip.  Referral to ENT.  Patient over 74 with a history of smoking.  Will refer for screening for abdominal aortic aneurysm.  He is due to see the urologist in 2 weeks so his exam was deferred. He will take home a heme assure. We'll treat with Lipitor 40 mg 3 times a week as a change from Crestor because of insurance issues. Referral made back to Dr. Fuller Plan so he can have his GI screening

## 2014-02-10 LAB — PSA: PSA: 1.54 ng/mL (ref <=4.00)

## 2014-02-16 ENCOUNTER — Encounter: Payer: Self-pay | Admitting: Gastroenterology

## 2014-02-16 LAB — IFOBT (OCCULT BLOOD): IMMUNOLOGICAL FECAL OCCULT BLOOD TEST: NEGATIVE

## 2014-04-15 ENCOUNTER — Ambulatory Visit (AMBULATORY_SURGERY_CENTER): Payer: Self-pay

## 2014-04-15 VITALS — Ht 68.0 in | Wt 160.0 lb

## 2014-04-15 DIAGNOSIS — Z1211 Encounter for screening for malignant neoplasm of colon: Secondary | ICD-10-CM

## 2014-04-15 MED ORDER — SUPREP BOWEL PREP KIT 17.5-3.13-1.6 GM/177ML PO SOLN: 1.0000 | Freq: Once | ORAL | Status: DC

## 2014-04-15 NOTE — Progress Notes (Signed)
No allergies to eggs or soy No phenteramine No home oxygen No past problems with anesthesia  Has email  Emmi instructions given for colonoscopy

## 2014-04-30 ENCOUNTER — Ambulatory Visit (AMBULATORY_SURGERY_CENTER): Payer: Federal, State, Local not specified - PPO | Admitting: Gastroenterology

## 2014-04-30 ENCOUNTER — Encounter: Payer: Self-pay | Admitting: Gastroenterology

## 2014-04-30 VITALS — BP 130/68 | HR 55 | Temp 95.9°F | Resp 18 | Ht 68.0 in | Wt 160.0 lb

## 2014-04-30 DIAGNOSIS — D123 Benign neoplasm of transverse colon: Secondary | ICD-10-CM

## 2014-04-30 DIAGNOSIS — Z1211 Encounter for screening for malignant neoplasm of colon: Secondary | ICD-10-CM

## 2014-04-30 HISTORY — PX: COLONOSCOPY: SHX174

## 2014-04-30 MED ORDER — SODIUM CHLORIDE 0.9 % IV SOLN: 500.0000 mL | INTRAVENOUS | Status: DC

## 2014-04-30 NOTE — Op Note (Signed)
[facility]  [address]. [place], [id number]   COLONOSCOPY PROCEDURE REPORT  PATIENT: Joseph Pitts, Joseph Pitts  MR#: [id number] BIRTHDATE: 10-07-1945 , 68  yrs. old GENDER: male ENDOSCOPIST: Ladene Artist, MD, [facility] REFERRED [id number] Everlene Farrier, M.D. PROCEDURE DATE:  04/30/2014 PROCEDURE:   Colonoscopy, screening and Colonoscopy with snare polypectomy First Screening Colonoscopy - Avg.  risk and is 50 yrs.  old or older - No.  Prior Negative Screening - Now for repeat screening. 10 or more years since last screening  History of Adenoma - Now for follow-up colonoscopy & has been > or = to 3 yrs.  N/A ASA CLASS:   Class II INDICATIONS:Screening for colonic neoplasia and Colorectal Neoplasm Risk Assessment for this procedure is average risk. MEDICATIONS: Monitored anesthesia care and Propofol 200 mg IV DESCRIPTION OF PROCEDURE:   After the risks benefits and alternatives of the procedure were thoroughly explained, informed consent was obtained.  The digital rectal exam revealed no abnormalities of the rectum.   The LB [id number]  endoscope was introduced through the anus and advanced to the cecum, which was identified by both the appendix and ileocecal valve. No adverse events experienced.   The quality of the prep was excellent. (Suprep was used)  The instrument was then slowly withdrawn as the colon was fully examined.    COLON FINDINGS: Two sessile polyps ranging between 5-8 mm in size were found in the transverse colon.  A polypectomy was performed with a cold snare.  The resection was complete, the polyp tissue was completely retrieved and sent to histology.   There was mild diverticulosis noted in the sigmoid colon.   The examination was otherwise normal.  Retroflexed views revealed internal Grade I hemorrhoids. The time to cecum = 5.3 Withdrawal time = 9.9   The scope was withdrawn and the procedure completed. COMPLICATIONS: There were no immediate  complications.  ENDOSCOPIC IMPRESSION: 1.   Two sessile polyps in the transverse colon; polypectomy performed with a cold snare 2.   Mild diverticulosis in the sigmoid colon 3.   Grade l internal hemorrhoids  RECOMMENDATIONS: 1.  Await pathology results 2.  Repeat colonoscopy in 5 years if polyp(s) adenomatous; otherwise 10 years 3.  High fiber diet with liberal fluid intake.  eSigned:  Ladene Artist, MD, [facility] 04/30/2014 9:56 AM   y]

## 2014-04-30 NOTE — Progress Notes (Signed)
Called to room to assist during endoscopic procedure.  Patient ID and intended procedure confirmed with present staff. Received instructions for my participation in the procedure from the performing physician.  

## 2014-04-30 NOTE — Patient Instructions (Signed)
YOU HAD AN ENDOSCOPIC PROCEDURE TODAY AT [facility]:   Refer to the procedure report that was given to you for any specific questions about what was found during the examination.  If the procedure report does not answer your questions, please call your gastroenterologist to clarify.  If you requested that your care partner not be given the details of your procedure findings, then the procedure report has been included in a sealed envelope for you to review at your convenience later.  YOU SHOULD EXPECT: Some feelings of bloating in the abdomen. Passage of more gas than usual.  Walking can help get rid of the air that was put into your GI tract during the procedure and reduce the bloating. If you had a lower endoscopy (such as a colonoscopy or flexible sigmoidoscopy) you may notice spotting of blood in your stool or on the toilet paper. If you underwent a bowel prep for your procedure, you may not have a normal bowel movement for a few days.  Please Note:  You might notice some irritation and congestion in your nose or some drainage.  This is from the oxygen used during your procedure.  There is no need for concern and it should clear up in a day or so.  SYMPTOMS TO REPORT IMMEDIATELY:   Following lower endoscopy (colonoscopy or flexible sigmoidoscopy):  Excessive amounts of blood in the stool  Significant tenderness or worsening of abdominal pains  Swelling of the abdomen that is new, acute  Fever of 100F or higher  For urgent or emergent issues, a gastroenterologist can be reached at any hour by calling [phone].   DIET: Your first meal following the procedure should be a small meal and then it is ok to progress to your normal diet. Heavy or fried foods are harder to digest and may make you feel nauseous or bloated.  Likewise, meals heavy in dairy and vegetables can increase bloating.  Drink plenty of fluids but you should avoid alcoholic beverages for 24  hours.  ACTIVITY:  You should plan to take it easy for the rest of today and you should NOT DRIVE or use heavy machinery until tomorrow (because of the sedation medicines used during the test).    FOLLOW UP: Our staff will call the number listed on your records the next business day following your procedure to check on you and address any questions or concerns that you may have regarding the information given to you following your procedure. If we do not reach you, we will leave a message.  However, if you are feeling well and you are not experiencing any problems, there is no need to return our call.  We will assume that you have returned to your regular daily activities without incident.  If any biopsies were taken you will be contacted by phone or by letter within the next 1-3 weeks.  Please call us at [phone] if you have not heard about the biopsies in 3 weeks.    SIGNATURES/CONFIDENTIALITY: You and/or your care partner have signed paperwork which will be entered into your electronic medical record.  These signatures attest to the fact that that the information above on your After Visit Summary has been reviewed and is understood.  Full responsibility of the confidentiality of this discharge information lies with you and/or your care-partner.  Recommendations Discharge instructions given to patient and/or care partner. Next colonoscopy determined by pathology results; 5 or 10 years. Polyp, diverticulosis, high fiber diet, and  hemorrhoid handouts provided.

## 2014-04-30 NOTE — Progress Notes (Signed)
Report to PACU, RN, vss, BBS= Clear.  

## 2014-05-03 ENCOUNTER — Telehealth: Payer: Self-pay

## 2014-05-03 NOTE — Telephone Encounter (Signed)
Left message on answering machine. 

## 2014-05-08 ENCOUNTER — Encounter: Payer: Self-pay | Admitting: Gastroenterology

## 2014-05-13 ENCOUNTER — Telehealth: Payer: Self-pay | Admitting: Gastroenterology

## 2014-05-13 NOTE — Telephone Encounter (Signed)
Patient notified of the results and recommendations.  All questions answered

## 2014-10-26 ENCOUNTER — Encounter: Payer: Self-pay | Admitting: Emergency Medicine

## 2015-02-15 ENCOUNTER — Encounter: Payer: Federal, State, Local not specified - PPO | Admitting: Emergency Medicine

## 2015-02-24 ENCOUNTER — Encounter: Payer: Federal, State, Local not specified - PPO | Admitting: Emergency Medicine

## 2015-03-09 DIAGNOSIS — L570 Actinic keratosis: Secondary | ICD-10-CM | POA: Diagnosis not present

## 2015-03-09 DIAGNOSIS — L821 Other seborrheic keratosis: Secondary | ICD-10-CM | POA: Diagnosis not present

## 2015-03-09 DIAGNOSIS — D2371 Other benign neoplasm of skin of right lower limb, including hip: Secondary | ICD-10-CM | POA: Diagnosis not present

## 2015-03-09 DIAGNOSIS — D1801 Hemangioma of skin and subcutaneous tissue: Secondary | ICD-10-CM | POA: Diagnosis not present

## 2015-03-09 DIAGNOSIS — L820 Inflamed seborrheic keratosis: Secondary | ICD-10-CM | POA: Diagnosis not present

## 2015-03-09 DIAGNOSIS — Z85828 Personal history of other malignant neoplasm of skin: Secondary | ICD-10-CM | POA: Diagnosis not present

## 2015-04-20 ENCOUNTER — Ambulatory Visit (INDEPENDENT_AMBULATORY_CARE_PROVIDER_SITE_OTHER): Payer: Federal, State, Local not specified - PPO | Admitting: Sports Medicine

## 2015-04-20 VITALS — BP 137/73 | Ht 68.0 in | Wt 155.0 lb

## 2015-04-20 DIAGNOSIS — S46812A Strain of other muscles, fascia and tendons at shoulder and upper arm level, left arm, initial encounter: Secondary | ICD-10-CM | POA: Diagnosis not present

## 2015-04-20 MED ORDER — NITROGLYCERIN 0.2 MG/HR TD PT24: MEDICATED_PATCH | TRANSDERMAL | Status: DC

## 2015-04-20 NOTE — Progress Notes (Signed)
  Joseph Pitts - 70 y.o. male MRN [id number]  Date of birth: [birth year]  CC: left shoulder pain  SUBJECTIVE:   HPI  Joseph Pitts returns with left shoulder pain x 3 weeks. Since his last visit his pain had resolved for several years.  He injured his shoulder 3 weeks ago lifting a box.  Since that time he has only had pain with overhead movements. He denies any falls or weakness.  No pain medications as the pain is only intermittent.  No ice or heat.  No rehab exercises.    ROS:     As above, no fevers chills or night sweats. No joint swelling. No new rashes.   HISTORY: Past Medical, Surgical, Social, and Family History Reviewed & Updated per EMR.  Pertinent Historical Findings include: Erectile dysfunction currently taking a phosphodiesterase inhibitor, no migraine history   OBJECTIVE: BP 137/73 mmHg  Ht 5\' 8"  (1.727 m)  Wt 155 lb (70.308 kg)  BMI 23.57 kg/m2  Physical Exam  Calm, NAD Non-labored breathing  Shoulder: left Inspection reveals no abnormalities, atrophy or asymmetry. Palpation is normal with no tenderness over AC joint or bicipital groove. TTP over the posterior deltoid insertion.  ROM is full in all planes. Rotator cuff strength normal throughout. Pain with resisted deltoid use.  No signs of impingement with negative Neer and Hawkin's tests, empty can. Speeds and Yergason's tests normal. No labral pathology noted with negative Obrien's, negative clunk and good stability. Normal scapular function observed. No painful arc and no drop arm sign. No apprehension sign  Imaging: [place] image of the L shoulder in both long and short axis obtained. Biceps tendon appears normal fibrillar pattern without surrounding effusion. Subscapularis appears normal without obvious tears or abnormalities. The supraspinatus tendon appears normal with no tears and a good footprint.  The infraspinatus and teres minor tendons appear normal with no tears and a good footprints. The  subdeltoid/subacromial bursa is unremarkable and shows no impingement with dynamic motion. The [facility] joint appears to have ample joint space and no spurring or effusion is present.  There is hypoechoic change in the posterior deltoid insertion.     MEDICATIONS, LABS & OTHER ORDERS: Previous Medications   ASPIRIN 81 MG TABLET    Take 81 mg by mouth daily.   ATORVASTATIN (LIPITOR) 40 MG TABLET    Take 1 tablet (40 mg total) by mouth daily.   FLUOROURACIL (EFUDEX) 5 % CREAM    APPLY ON THE SKIN TWICE A DAY X 2 WEEKS TO AREAS DISCUSSED   MULTIPLE VITAMIN (MULTIVITAMIN) TABLET    Take 1 tablet by mouth daily.   ROSUVASTATIN (CRESTOR) 10 MG TABLET    Take 10 mg by mouth.   Modified Medications   No medications on file   New Prescriptions   NITROGLYCERIN (NITRODUR - DOSED IN MG/24 HR) 0.2 MG/HR PATCH    Place 1/4 patch to affected area daily   Discontinued Medications   No medications on file  No orders of the defined types were placed in this encounter.   ASSESSMENT & PLAN: Joseph Pitts appears to have sustained a posterior deltoid insertional strain, Grade I: We will start him on NTG patches and discussed s/e, risks. He will follow up in 6 weeks.  We discussed RoM exercises in addition to gentle strength training. Call with any questions.

## 2015-04-20 NOTE — Patient Instructions (Signed)

## 2015-04-26 ENCOUNTER — Encounter: Payer: Self-pay | Admitting: Sports Medicine

## 2015-06-03 ENCOUNTER — Encounter: Payer: Self-pay | Admitting: Sports Medicine

## 2015-06-03 ENCOUNTER — Other Ambulatory Visit: Payer: Self-pay | Admitting: *Deleted

## 2015-06-03 MED ORDER — NITROGLYCERIN 0.2 MG/HR TD PT24: MEDICATED_PATCH | TRANSDERMAL | Status: DC

## 2015-06-29 ENCOUNTER — Ambulatory Visit (INDEPENDENT_AMBULATORY_CARE_PROVIDER_SITE_OTHER): Payer: Federal, State, Local not specified - PPO | Admitting: Sports Medicine

## 2015-06-29 ENCOUNTER — Encounter: Payer: Self-pay | Admitting: Sports Medicine

## 2015-06-29 VITALS — BP 144/74 | HR 55 | Ht 68.0 in | Wt 155.0 lb

## 2015-06-29 DIAGNOSIS — S46912A Strain of unspecified muscle, fascia and tendon at shoulder and upper arm level, left arm, initial encounter: Secondary | ICD-10-CM

## 2015-06-29 DIAGNOSIS — M25512 Pain in left shoulder: Secondary | ICD-10-CM

## 2015-06-29 DIAGNOSIS — S46919A Strain of unspecified muscle, fascia and tendon at shoulder and upper arm level, unspecified arm, initial encounter: Secondary | ICD-10-CM | POA: Insufficient documentation

## 2015-06-29 DIAGNOSIS — S4352XA Sprain of left acromioclavicular joint, initial encounter: Secondary | ICD-10-CM | POA: Diagnosis not present

## 2015-06-29 MED ORDER — DICLOFENAC SODIUM 1 % TD GEL: 2.0000 g | Freq: Four times a day (QID) | TRANSDERMAL | Status: DC

## 2015-06-29 NOTE — Assessment & Plan Note (Signed)
This has resolved and on last visit seemed a deltoid injury  Keep up Rehab and may move NTG to [facility] area

## 2015-06-29 NOTE — Progress Notes (Signed)
Patient ID: Joseph Pitts, male   DOB: 07/24/1945, 70 y.o.   MRN: [id number]  CC: F/u R shoulder pain  HPI:  Joseph Pitts is an active 70 yo male presenting for f/u visit concerning Grade I posterior deltoid insertional strain sustained 3 months ago.  Pt states with nitrogen patches and ROM exercises feels more or less back to normal from this standpoint.  Still using patches to this day and feels like he has good strength and motion in the shoulder.    However, three weeks ago said he sustained a new injury to the L shoulder.  Was lifting a "50 pound ladder" and when went to set it down felt a "pull" on that shoulder and it immediately felt warm (points to [facility] joint).  Did not pop, did not cause severe pain, did not swell, just was quite uncomfortable and is worried he pulled something.  Since then has felt pinching pain at the site only with certain motions, no pain/tingling shooting down arm, no pain in shoulder joint.  Has started placing nitro patches "closer" to this spot although not on it, unsure if this has helped.     Also notes is using wife's Voltaren gel for R thumb arthritis (points to [facility] joint), states it works well and wondering if he can get his own prescription.    ROS:  Gen: - fevers/chills MSK: - except as per HPI Neuro: - radicular symptoms down arm  PE:  BP 144/74 mmHg  Pulse 55  Ht 5\' 8"  (1.727 m)  Wt 155 lb (70.308 kg)  BMI 23.57 kg/m2 Gen: 70 yo male sitting on exam table in NAD HEENT: normocephalic, atraumatic Pulm: normal work of breathing MSK:  L arm: inspection of shoulder reveals good musculature and no overlying skin changes however distal clavicle edge displaced roughly .5 cm superiorly compared to L (forms lip over associated acromion), non-TTP over entire shoulder both posterior, anterior, and lateral, full active ROM, 5/5 strength throughout extremity, sensation grossly intact, - empty can, Neer, Hawkings R arm: inspection, palpation, ROM, motor, and  sensation wnl  A/P:  Joseph Pitts is a 70 yo male fully healed from posterior deltoid strain but with new Grade I AC separation  Grade I AC separation Most likely diagnosis given visible deformity on exam and only slight raise of clavicle (not completely displaced, or Grade III) -- Counseled pt that this is not a serious injury and he can expect a return to normal within one or two months with interventions below -- Advised to use nitro patch right at the site -- Do deltoid lifts with dumbells (close to body, not flys) -- Avoid particularly painful/straining shoulder exercises -- If not better in 6 weeks, make appointment  R [facility] joint osteoarthritis Diagnosed based on focal pain at the site and positive response to Voltaren gel -- Provide prescription for 1% Voltaren cream to use four times daily for pain  F/u PRN  Nils Flack, MS4  Agree with assessment and plan/ done with me present.  Ila Mcgill, MD

## 2015-06-29 NOTE — Assessment & Plan Note (Signed)
This should heal with time Given some easy rehab for MM around shoulder joint Avoid xover moves particularly with lifting  Reck if not better Use NTG patches

## 2015-10-12 ENCOUNTER — Ambulatory Visit (INDEPENDENT_AMBULATORY_CARE_PROVIDER_SITE_OTHER): Payer: Federal, State, Local not specified - PPO | Admitting: Sports Medicine

## 2015-10-12 ENCOUNTER — Encounter: Payer: Self-pay | Admitting: Sports Medicine

## 2015-10-12 ENCOUNTER — Other Ambulatory Visit: Payer: Self-pay

## 2015-10-12 VITALS — BP 118/68 | HR 55 | Ht 68.0 in | Wt 153.0 lb

## 2015-10-12 DIAGNOSIS — M25512 Pain in left shoulder: Secondary | ICD-10-CM

## 2015-10-12 NOTE — Progress Notes (Signed)
  Joseph Pitts - 70 y.o. male MRN [id number]  Date of birth: [birth year]  SUBJECTIVE:  Including CC & ROS.  Chief Complaint  Patient presents with  . Shoulder Pain    Joseph Pitts is a 70 yo M that is following up with left shoulder pain. He had an exacerbation of his pain in June of this year after he was lifting a ladder. He reports the pain started about 2 weeks ago after he woke up one morning. He denies any acute injury this time. The pain is at the proximal shoulder. He has been using nitro. Denies taking any medications. The pain is localized. 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  ROS: No unexpected weight loss, fever, chills, swelling, instability, numbness/tingling, redness, otherwise see HPI    HISTORY: Past Medical, Surgical, Social, and Family History Reviewed & Updated per EMR.   Pertinent Historical Findings include: PMSHx - first degree heart block, grade 1 AC separation   PSHx -  No tobacco or alcohol use  FHx -  Heart disease, prostate cancer.   DATA REVIEWED: 08/30/13: left shoulder x-ray: Widened acromioclavicular joint on the left.  PHYSICAL EXAM:  VS: BP:118/68  HR:(!) 55bpm  TEMP: ( )  RESP:   HT:5\' 8"  (172.7 cm)   WT:153 lb (69.4 kg)  BMI:23.3 PHYSICAL EXAM: Gen: NAD, alert, cooperative with exam, well-appearing HEENT: clear conjunctiva, EOMI CV:  no edema, capillary refill brisk,  Resp: non-labored, normal speech Skin: no rashes, normal turgor  Neuro: no gross deficits.  Psych:  alert and oriented Left Shoulder: Inspection reveals abnormality with elevation of his clavicle near his [facility] joint  Palpation is normal with no tenderness over AC joint or bicipital groove. ROM is full in all planes. Rotator cuff strength normal throughout. No signs of impingement with negative Hawkin's tests, empty can sign. Neurovascularly intact   Limited US: Left Shoulder: the biceps tendon was viewed in long and short axis and appeared to be  normal. The subscapularis and supraspinatous was viewed and normal. Dynamic testing of the supraspinatous and subscapularis was negative for impingement. The [facility] joint was widened with a mushroom sign indicating an effusion.    ASSESSMENT & PLAN:   Left shoulder pain The pain today seems to be related to his AC joint. Denies any injury and worse with sleeping on affected side. US exam looked reassuring.   - can try Voltaren gel over the area which he has already been prescribed.   - f/u PRN. If f/u then can consider an injection into the area.

## 2015-10-13 NOTE — Assessment & Plan Note (Signed)
The pain today seems to be related to his [facility] joint. Denies any injury and worse with sleeping on affected side. US exam looked reassuring.   - can try Voltaren gel over the area which he has already been prescribed.   - f/u PRN. If f/u then can consider an injection into the area.

## 2016-01-26 ENCOUNTER — Ambulatory Visit (INDEPENDENT_AMBULATORY_CARE_PROVIDER_SITE_OTHER): Payer: 59 | Admitting: Sports Medicine

## 2016-01-26 ENCOUNTER — Ambulatory Visit: Payer: Self-pay

## 2016-01-26 ENCOUNTER — Encounter: Payer: Self-pay | Admitting: Sports Medicine

## 2016-01-26 VITALS — BP 135/75 | HR 67 | Ht 68.0 in | Wt 154.0 lb

## 2016-01-26 DIAGNOSIS — M23305 Other meniscus derangements, unspecified medial meniscus, unspecified knee: Secondary | ICD-10-CM | POA: Insufficient documentation

## 2016-01-26 DIAGNOSIS — M25561 Pain in right knee: Secondary | ICD-10-CM | POA: Diagnosis not present

## 2016-01-26 DIAGNOSIS — M23303 Other meniscus derangements, unspecified medial meniscus, right knee: Secondary | ICD-10-CM | POA: Diagnosis not present

## 2016-01-26 DIAGNOSIS — G8929 Other chronic pain: Secondary | ICD-10-CM
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
## 2016-01-26 NOTE — Assessment & Plan Note (Signed)
Begin stabilization exercises with quad and hip Use compression sleeve  xtrain on bike  Use hiking poles for downyhill  Reck if persistent pain or problems OK to use OTC NSAIDs

## 2016-03-08 DIAGNOSIS — L570 Actinic keratosis: Secondary | ICD-10-CM | POA: Diagnosis not present

## 2016-03-08 DIAGNOSIS — Z85828 Personal history of other malignant neoplasm of skin: Secondary | ICD-10-CM | POA: Diagnosis not present

## 2016-03-08 DIAGNOSIS — L309 Dermatitis, unspecified: Secondary | ICD-10-CM | POA: Diagnosis not present

## 2016-03-08 DIAGNOSIS — D225 Melanocytic nevi of trunk: Secondary | ICD-10-CM | POA: Diagnosis not present

## 2016-03-08 DIAGNOSIS — L821 Other seborrheic keratosis: Secondary | ICD-10-CM | POA: Diagnosis not present

## 2016-03-22 DIAGNOSIS — H25813 Combined forms of age-related cataract, bilateral: Secondary | ICD-10-CM | POA: Diagnosis not present

## 2016-03-22 DIAGNOSIS — H1013 Acute atopic conjunctivitis, bilateral: Secondary | ICD-10-CM | POA: Diagnosis not present

## 2016-03-22 DIAGNOSIS — H348322 Tributary (branch) retinal vein occlusion, left eye, stable: Secondary | ICD-10-CM | POA: Diagnosis not present

## 2016-04-19 DIAGNOSIS — J208 Acute bronchitis due to other specified organisms: Secondary | ICD-10-CM | POA: Diagnosis not present

## 2016-04-19 DIAGNOSIS — B338 Other specified viral diseases: Secondary | ICD-10-CM | POA: Diagnosis not present

## 2016-05-10 ENCOUNTER — Ambulatory Visit (INDEPENDENT_AMBULATORY_CARE_PROVIDER_SITE_OTHER): Payer: Medicare Other | Admitting: Sports Medicine

## 2016-05-10 ENCOUNTER — Encounter: Payer: Self-pay | Admitting: Sports Medicine

## 2016-05-10 DIAGNOSIS — M23303 Other meniscus derangements, unspecified medial meniscus, right knee: Secondary | ICD-10-CM | POA: Diagnosis present

## 2016-05-10 NOTE — Progress Notes (Signed)
CC: RT Knee pain  Seen in January with findings of deg meniscus and early OA of RT knee Was able to do a series of long hikes in [place] Did well but pain on downhills  A few weeks ago had acute onset of worse medial joint line pain Knee swelled up and difficult to walk Ibuprofen and ice helped  Now actually feels less pain Still aware of tightness w bending knee  ROS No locking No giving way No swelling since first episode  PE Muscular M in NAD BP 137/72   Knee: RT Normal to inspection with no erythema or effusion or obvious bony abnormalities. Palpation normal with no warmth or joint line tenderness or patellar tenderness or condyle tenderness. ROM  in flexion limited to 120 deg and painful past that In extension no pain and now with almost full extension No pain on lower leg rotation. Ligaments with solid consistent endpoints including ACL, PCL, LCL, MCL. Negative Mcmurray's and provocative meniscal tests. Non painful patellar compression. Patellar and quadriceps tendons unremarkable. Hamstring and quadriceps strength is normal.

## 2016-05-10 NOTE — Assessment & Plan Note (Signed)
Still having sxs consistent with OA and deg meniscus  Suspect he had a loose body that has now changed position  I encouraged cont. HEP and activity Still pretty functional and good candidate for conservative care  If swelling recurs RTC for CSI injection  PRN Ice and ibuprofen

## 2016-06-20 DIAGNOSIS — E162 Hypoglycemia, unspecified: Secondary | ICD-10-CM | POA: Diagnosis not present

## 2016-06-20 DIAGNOSIS — Z79899 Other long term (current) drug therapy: Secondary | ICD-10-CM | POA: Diagnosis not present

## 2016-06-20 DIAGNOSIS — Z0000 Encounter for general adult medical examination without abnormal findings: Secondary | ICD-10-CM | POA: Diagnosis not present

## 2016-06-20 DIAGNOSIS — E7800 Pure hypercholesterolemia, unspecified: Secondary | ICD-10-CM | POA: Diagnosis not present

## 2016-06-20 DIAGNOSIS — Z1389 Encounter for screening for other disorder: Secondary | ICD-10-CM | POA: Diagnosis not present

## 2016-07-23 DIAGNOSIS — Z79899 Other long term (current) drug therapy: Secondary | ICD-10-CM | POA: Diagnosis not present

## 2016-07-23 DIAGNOSIS — E7800 Pure hypercholesterolemia, unspecified: Secondary | ICD-10-CM | POA: Diagnosis not present

## 2016-07-24 ENCOUNTER — Other Ambulatory Visit: Payer: Self-pay | Admitting: Geriatric Medicine

## 2016-07-24 DIAGNOSIS — R748 Abnormal levels of other serum enzymes: Secondary | ICD-10-CM

## 2016-08-06 ENCOUNTER — Ambulatory Visit
Admission: RE | Admit: 2016-08-06 | Discharge: 2016-08-06 | Disposition: A | Discharge status: Home / Self Care | Payer: Medicare Other | Source: Ambulatory Visit | Attending: Geriatric Medicine | Admitting: Geriatric Medicine

## 2016-08-06 DIAGNOSIS — R748 Abnormal levels of other serum enzymes: Secondary | ICD-10-CM

## 2016-08-09 DIAGNOSIS — K769 Liver disease, unspecified: Secondary | ICD-10-CM | POA: Diagnosis not present

## 2016-08-15 ENCOUNTER — Encounter: Payer: Self-pay | Admitting: Gastroenterology

## 2016-09-07 DIAGNOSIS — N401 Enlarged prostate with lower urinary tract symptoms: Secondary | ICD-10-CM | POA: Diagnosis not present

## 2016-09-07 DIAGNOSIS — N5201 Erectile dysfunction due to arterial insufficiency: Secondary | ICD-10-CM | POA: Diagnosis not present

## 2016-09-07 DIAGNOSIS — R3912 Poor urinary stream: Secondary | ICD-10-CM | POA: Diagnosis not present

## 2016-09-25 DIAGNOSIS — Z23 Encounter for immunization: Secondary | ICD-10-CM | POA: Diagnosis not present

## 2016-10-09 ENCOUNTER — Encounter: Payer: Self-pay | Admitting: Gastroenterology

## 2016-10-09 ENCOUNTER — Other Ambulatory Visit (INDEPENDENT_AMBULATORY_CARE_PROVIDER_SITE_OTHER): Payer: Medicare Other

## 2016-10-09 ENCOUNTER — Ambulatory Visit (INDEPENDENT_AMBULATORY_CARE_PROVIDER_SITE_OTHER): Payer: Medicare Other | Admitting: Gastroenterology

## 2016-10-09 VITALS — BP 120/66 | HR 71 | Ht 68.0 in | Wt 156.0 lb

## 2016-10-09 DIAGNOSIS — D509 Iron deficiency anemia, unspecified: Secondary | ICD-10-CM | POA: Diagnosis not present

## 2016-10-09 DIAGNOSIS — K760 Fatty (change of) liver, not elsewhere classified: Secondary | ICD-10-CM

## 2016-10-09 DIAGNOSIS — R7989 Other specified abnormal findings of blood chemistry: Secondary | ICD-10-CM

## 2016-10-09 DIAGNOSIS — R945 Abnormal results of liver function studies: Principal | ICD-10-CM

## 2016-10-09 LAB — IGA: IGA: 97 mg/dL (ref 68–378)

## 2016-10-09 LAB — PROTIME-INR
INR: 1.1 ratio — ABNORMAL HIGH (ref 0.8–1.0)
PROTHROMBIN TIME: 12.2 s (ref 9.6–13.1)

## 2016-10-09 NOTE — Progress Notes (Signed)
    History of Present Illness: This is a 71 year old white male referred by Dr. Lajean Manes for evaluation of abnormal LFTs and fatty liver noted on abdominal ultrasound. Patient has had modestly elevated AST and alkaline phosphatase noted on several occasions over the past couple of years by review of blood work in referral records. In July 2018 he had an extensive serologic workup which showed a minimally elevated anti-smooth muscle antibody at 51 low ferritin at 11.2 low iron saturation at 16%. All other studies were unremarkable including ANA, AMA, HBV and HCV. Patient has a long history of hyperlipidemia reports one beer per day alcohol consumption.   IMPRESSION: 1. Increase echogenicity consistent with fatty infiltration and/or hepatocellular disease. No focal hepatic abnormality identified. 2. No gallstones or biliary distention.  Colonoscopy 04/2014 for screening: 1. Two sessile polyps in the transverse colon; polypectomy performed with a cold snare (tubular adenomas)  2. Mild diverticulosis in the sigmoid colon 3. Grade l internal hemorrhoids  Current Medications, Allergies, Past Medical History, Past Surgical History, Family History and Social History were reviewed in [employer] record.  Physical Exam: General: Well developed, well nourished, no acute distress Head: Normocephalic and atraumatic Eyes:  sclerae anicteric, EOMI Ears: Normal auditory acuity Mouth: No deformity or lesions Lungs: Clear throughout to auscultation Heart: Regular rate and rhythm; no murmurs, rubs or bruits Abdomen: Soft, non tender and non distended. No masses, hepatosplenomegaly or hernias noted. Normal Bowel sounds Musculoskeletal: Symmetrical with no gross deformities  Pulses:  Normal pulses noted Extremities: No clubbing, cyanosis, edema or deformities noted Neurological: Alert oriented x 4, grossly nonfocal Psychological:  Alert and cooperative. Normal mood and  affect  Assessment and Recommendations:  1. Elevated LFTs very likely secondary to fatty liver. The mildly elevated anti-smooth muscle antibody is not likely clinically significant given a negative ANA. Further evaluation with alpha 1 antitrypsin, ceruloplasmin, tTG, IgA, PT/INR. Pending above blood tests, the option of liver biopsy was discussed to further evaluate the extent of fatty liver disease, whether he has NASH or unlikely any other types of liver disease. He is not inclined to proceed with liver biopsy at this time and I think this is reasonable unless blood work findings above or ongoing LFT monitoring changes substantially. Continue to appropriately treat hyperlipidemia with diet, statins, etc. suggest his PCP monitors LFTs and PT/INR at regular intervals likely every few months.  2. Iron deficiency anemia. Colonoscopy approximately 2 years ago was unremarkable. Obtain blood work to assess for celiac disease: tTG, IgA. If celiac antibodies are positive will proceed with EGD to confirm diagnosis. If celiac antibodies are negative and iron deficiency anemia is refractory or recurrent recommend EGD for further evaluation.

## 2016-10-09 NOTE — Patient Instructions (Signed)
Your physician has requested that you go to the basement for lab work before leaving today.  Normal BMI (Body Mass Index- based on height and weight) is between 23 and 30. Your BMI today is Body mass index is 23.72 kg/m. Marland Kitchen Please consider follow up  regarding your BMI with your Primary Care Provider.  Thank you for choosing me and [facility].  Pricilla Riffle. Dagoberto Ligas., MD., Marval Regal

## 2016-10-10 LAB — ALPHA-1-ANTITRYPSIN: A1 ANTITRYPSIN SER: 148 mg/dL (ref 83–199)

## 2016-10-10 LAB — TISSUE TRANSGLUTAMINASE, IGA: (TTG) AB, IGA: 1 U/mL

## 2016-10-10 LAB — CERULOPLASMIN: Ceruloplasmin: 28 mg/dL (ref 18–36)

## 2017-01-29 ENCOUNTER — Other Ambulatory Visit: Payer: Self-pay | Admitting: Sports Medicine

## 2017-02-05 ENCOUNTER — Other Ambulatory Visit: Payer: Self-pay | Admitting: *Deleted

## 2017-02-05 MED ORDER — DICLOFENAC SODIUM 1 % TD GEL: 2.0000 g | Freq: Four times a day (QID) | TRANSDERMAL | 1 refills | Status: DC

## 2017-03-13 DIAGNOSIS — D2371 Other benign neoplasm of skin of right lower limb, including hip: Secondary | ICD-10-CM | POA: Diagnosis not present

## 2017-03-13 DIAGNOSIS — L821 Other seborrheic keratosis: Secondary | ICD-10-CM | POA: Diagnosis not present

## 2017-03-13 DIAGNOSIS — Z85828 Personal history of other malignant neoplasm of skin: Secondary | ICD-10-CM | POA: Diagnosis not present

## 2017-03-13 DIAGNOSIS — D1801 Hemangioma of skin and subcutaneous tissue: Secondary | ICD-10-CM | POA: Diagnosis not present

## 2017-03-13 DIAGNOSIS — L570 Actinic keratosis: Secondary | ICD-10-CM | POA: Diagnosis not present

## 2017-04-24 DIAGNOSIS — R3912 Poor urinary stream: Secondary | ICD-10-CM | POA: Diagnosis not present

## 2017-04-24 DIAGNOSIS — N401 Enlarged prostate with lower urinary tract symptoms: Secondary | ICD-10-CM | POA: Diagnosis not present

## 2017-05-02 DIAGNOSIS — H348322 Tributary (branch) retinal vein occlusion, left eye, stable: Secondary | ICD-10-CM | POA: Diagnosis not present

## 2017-05-02 DIAGNOSIS — H5703 Miosis: Secondary | ICD-10-CM | POA: Diagnosis not present

## 2017-05-02 DIAGNOSIS — H2513 Age-related nuclear cataract, bilateral: Secondary | ICD-10-CM | POA: Diagnosis not present

## 2017-05-03 DIAGNOSIS — E7800 Pure hypercholesterolemia, unspecified: Secondary | ICD-10-CM | POA: Diagnosis not present

## 2017-05-03 DIAGNOSIS — R091 Pleurisy: Secondary | ICD-10-CM | POA: Diagnosis not present

## 2017-07-04 DIAGNOSIS — E7800 Pure hypercholesterolemia, unspecified: Secondary | ICD-10-CM | POA: Diagnosis not present

## 2017-07-04 DIAGNOSIS — J309 Allergic rhinitis, unspecified: Secondary | ICD-10-CM | POA: Diagnosis not present

## 2017-07-04 DIAGNOSIS — Z0000 Encounter for general adult medical examination without abnormal findings: Secondary | ICD-10-CM | POA: Diagnosis not present

## 2017-07-04 DIAGNOSIS — Z1389 Encounter for screening for other disorder: Secondary | ICD-10-CM | POA: Diagnosis not present

## 2017-07-04 DIAGNOSIS — Z79899 Other long term (current) drug therapy: Secondary | ICD-10-CM | POA: Diagnosis not present

## 2017-07-04 DIAGNOSIS — K219 Gastro-esophageal reflux disease without esophagitis: Secondary | ICD-10-CM | POA: Diagnosis not present

## 2017-07-19 ENCOUNTER — Encounter: Payer: Medicare Other | Admitting: Internal Medicine

## 2017-08-02 ENCOUNTER — Ambulatory Visit: Payer: Federal, State, Local not specified - PPO | Admitting: Podiatry

## 2017-08-06 ENCOUNTER — Ambulatory Visit: Payer: Federal, State, Local not specified - PPO | Admitting: Podiatry

## 2017-08-12 DIAGNOSIS — Z23 Encounter for immunization: Secondary | ICD-10-CM | POA: Diagnosis not present

## 2017-09-27 DIAGNOSIS — Z23 Encounter for immunization: Secondary | ICD-10-CM | POA: Diagnosis not present

## 2018-03-13 DIAGNOSIS — L821 Other seborrheic keratosis: Secondary | ICD-10-CM | POA: Diagnosis not present

## 2018-03-13 DIAGNOSIS — D485 Neoplasm of uncertain behavior of skin: Secondary | ICD-10-CM | POA: Diagnosis not present

## 2018-03-13 DIAGNOSIS — L570 Actinic keratosis: Secondary | ICD-10-CM | POA: Diagnosis not present

## 2018-03-13 DIAGNOSIS — D224 Melanocytic nevi of scalp and neck: Secondary | ICD-10-CM | POA: Diagnosis not present

## 2018-03-13 DIAGNOSIS — D0471 Carcinoma in situ of skin of right lower limb, including hip: Secondary | ICD-10-CM | POA: Diagnosis not present

## 2018-03-13 DIAGNOSIS — Z85828 Personal history of other malignant neoplasm of skin: Secondary | ICD-10-CM | POA: Diagnosis not present

## 2018-03-20 DIAGNOSIS — H2513 Age-related nuclear cataract, bilateral: Secondary | ICD-10-CM | POA: Diagnosis not present

## 2018-04-08 ENCOUNTER — Observation Stay (HOSPITAL_COMMUNITY): Payer: Medicare Other

## 2018-04-08 ENCOUNTER — Emergency Department (HOSPITAL_COMMUNITY): Payer: Medicare Other

## 2018-04-08 ENCOUNTER — Encounter (HOSPITAL_COMMUNITY): Payer: Self-pay | Admitting: Emergency Medicine

## 2018-04-08 ENCOUNTER — Observation Stay (HOSPITAL_COMMUNITY)
Admission: EM | Admit: 2018-04-08 | Discharge: 2018-04-09 | Disposition: A | Discharge status: Home / Self Care | Payer: Medicare Other | Attending: Internal Medicine | Admitting: Internal Medicine

## 2018-04-08 ENCOUNTER — Other Ambulatory Visit: Payer: Self-pay

## 2018-04-08 DIAGNOSIS — Z87891 Personal history of nicotine dependence: Secondary | ICD-10-CM

## 2018-04-08 DIAGNOSIS — I10 Essential (primary) hypertension: Secondary | ICD-10-CM | POA: Diagnosis present

## 2018-04-08 DIAGNOSIS — Z8249 Family history of ischemic heart disease and other diseases of the circulatory system: Secondary | ICD-10-CM | POA: Insufficient documentation

## 2018-04-08 DIAGNOSIS — Z882 Allergy status to sulfonamides status: Secondary | ICD-10-CM | POA: Insufficient documentation

## 2018-04-08 DIAGNOSIS — Z7982 Long term (current) use of aspirin: Secondary | ICD-10-CM | POA: Insufficient documentation

## 2018-04-08 DIAGNOSIS — F810 Specific reading disorder: Secondary | ICD-10-CM | POA: Diagnosis not present

## 2018-04-08 DIAGNOSIS — Z8673 Personal history of transient ischemic attack (TIA), and cerebral infarction without residual deficits: Secondary | ICD-10-CM | POA: Diagnosis not present

## 2018-04-08 DIAGNOSIS — G459 Transient cerebral ischemic attack, unspecified: Principal | ICD-10-CM | POA: Diagnosis present

## 2018-04-08 DIAGNOSIS — E785 Hyperlipidemia, unspecified: Secondary | ICD-10-CM | POA: Diagnosis present

## 2018-04-08 DIAGNOSIS — Z79899 Other long term (current) drug therapy: Secondary | ICD-10-CM | POA: Diagnosis not present

## 2018-04-08 DIAGNOSIS — H3562 Retinal hemorrhage, left eye: Secondary | ICD-10-CM | POA: Diagnosis present

## 2018-04-08 HISTORY — DX: Transient cerebral ischemic attack, unspecified: G45.9

## 2018-04-08 LAB — COMPREHENSIVE METABOLIC PANEL
ALK PHOS: 103 U/L (ref 38–126)
ALT: 28 U/L (ref 0–44)
AST: 34 U/L (ref 15–41)
Albumin: 4 g/dL (ref 3.5–5.0)
Anion gap: 9 (ref 5–15)
BUN: 24 mg/dL — ABNORMAL HIGH (ref 8–23)
CO2: 25 mmol/L (ref 22–32)
Calcium: 9.4 mg/dL (ref 8.9–10.3)
Chloride: 103 mmol/L (ref 98–111)
Creatinine, Ser: 1.1 mg/dL (ref 0.61–1.24)
GFR calc Af Amer: >60 mL/min (ref >60)
GFR calc non Af Amer: >60 mL/min (ref >60)
Glucose, Bld: 109 mg/dL — ABNORMAL HIGH (ref 70–99)
Potassium: 4.3 mmol/L (ref 3.5–5.1)
SODIUM: 137 mmol/L (ref 135–145)
Total Bilirubin: 0.9 mg/dL (ref 0.3–1.2)
Total Protein: 6.4 g/dL — ABNORMAL LOW (ref 6.5–8.1)

## 2018-04-08 LAB — CBC
HCT: 44.9 % (ref 39.0–52.0)
Hemoglobin: 15 g/dL (ref 13.0–17.0)
MCH: 29.8 pg (ref 26.0–34.0)
MCHC: 33.4 g/dL (ref 30.0–36.0)
MCV: 89.1 fL (ref 80.0–100.0)
NRBC: 0 % (ref 0.0–0.2)
Platelets: 246 10*3/uL (ref 150–400)
RBC: 5.04 MIL/uL (ref 4.22–5.81)
RDW: 13 % (ref 11.5–15.5)
WBC: 6 10*3/uL (ref 4.0–10.5)

## 2018-04-08 LAB — ETHANOL: Alcohol, Ethyl (B): <10 mg/dL (ref <10)

## 2018-04-08 LAB — DIFFERENTIAL
Abs Immature Granulocytes: 0.01 10*3/uL (ref 0.00–0.07)
Basophils Absolute: 0 10*3/uL (ref 0.0–0.1)
Basophils Relative: 0 %
Eosinophils Absolute: 0.1 10*3/uL (ref 0.0–0.5)
Eosinophils Relative: 2 %
Immature Granulocytes: 0 %
LYMPHS PCT: 35 %
Lymphs Abs: 2.1 10*3/uL (ref 0.7–4.0)
Monocytes Absolute: 0.4 10*3/uL (ref 0.1–1.0)
Monocytes Relative: 7 %
Neutro Abs: 3.4 10*3/uL (ref 1.7–7.7)
Neutrophils Relative %: 56 %

## 2018-04-08 LAB — TSH: TSH: 1.705 u[IU]/mL (ref 0.350–4.500)

## 2018-04-08 LAB — URINALYSIS, ROUTINE W REFLEX MICROSCOPIC
Bilirubin Urine: NEGATIVE
Glucose, UA: NEGATIVE mg/dL
HGB URINE DIPSTICK: NEGATIVE
Ketones, ur: NEGATIVE mg/dL
Leukocytes,Ua: NEGATIVE
Nitrite: NEGATIVE
Protein, ur: NEGATIVE mg/dL
Specific Gravity, Urine: 1.02 (ref 1.005–1.030)
pH: 6 (ref 5.0–8.0)

## 2018-04-08 LAB — PROTIME-INR
INR: 1 (ref 0.8–1.2)
PROTHROMBIN TIME: 13.3 s (ref 11.4–15.2)

## 2018-04-08 LAB — CBG MONITORING, ED: Glucose-Capillary: 104 mg/dL — ABNORMAL HIGH (ref 70–99)

## 2018-04-08 LAB — RAPID URINE DRUG SCREEN, HOSP PERFORMED
Amphetamines: NOT DETECTED
BARBITURATES: NOT DETECTED
Benzodiazepines: NOT DETECTED
Cocaine: NOT DETECTED
Opiates: NOT DETECTED
Tetrahydrocannabinol: NOT DETECTED

## 2018-04-08 LAB — APTT: aPTT: 41 seconds — ABNORMAL HIGH (ref 24–36)

## 2018-04-08 LAB — I-STAT CREATININE, ED: Creatinine, Ser: 1 mg/dL (ref 0.61–1.24)

## 2018-04-08 MED ORDER — STROKE: EARLY STAGES OF RECOVERY BOOK
Freq: Once | Status: AC
  Administered 2018-04-08: 18:00:00

## 2018-04-08 MED ORDER — CLOPIDOGREL BISULFATE 75 MG PO TABS
75.0000 mg | ORAL_TABLET | Freq: Every day | ORAL | Status: DC
  Administered 2018-04-09: 75 mg via ORAL
  Filled 2018-04-08: qty 1

## 2018-04-08 MED ORDER — SODIUM CHLORIDE 0.9 % IV SOLN
INTRAVENOUS | Status: DC
  Administered 2018-04-08: 18:00:00 via INTRAVENOUS

## 2018-04-08 MED ORDER — SENNOSIDES-DOCUSATE SODIUM 8.6-50 MG PO TABS: 1.0000 | ORAL_TABLET | Freq: Every evening | ORAL | Status: DC | PRN

## 2018-04-08 MED ORDER — ACETAMINOPHEN 650 MG RE SUPP: 650.0000 mg | RECTAL | Status: DC | PRN

## 2018-04-08 MED ORDER — ENOXAPARIN SODIUM 40 MG/0.4ML ~~LOC~~ SOLN
40.0000 mg | SUBCUTANEOUS | Status: DC
  Administered 2018-04-08 – 2018-04-09 (×2): 40 mg via SUBCUTANEOUS
  Filled 2018-04-08 (×2): qty 0.4

## 2018-04-08 MED ORDER — ASPIRIN 81 MG PO CHEW
81.0000 mg | CHEWABLE_TABLET | Freq: Every day | ORAL | Status: DC
  Administered 2018-04-09: 81 mg via ORAL
  Filled 2018-04-08: qty 1

## 2018-04-08 MED ORDER — ATORVASTATIN CALCIUM 40 MG PO TABS
40.0000 mg | ORAL_TABLET | Freq: Every day | ORAL | Status: DC
  Administered 2018-04-09: 40 mg via ORAL
  Filled 2018-04-08: qty 1

## 2018-04-08 MED ORDER — ACETAMINOPHEN 325 MG PO TABS: 650.0000 mg | ORAL_TABLET | ORAL | Status: DC | PRN

## 2018-04-08 MED ORDER — ACETAMINOPHEN 160 MG/5ML PO SOLN: 650.0000 mg | ORAL | Status: DC | PRN

## 2018-04-08 MED ORDER — CLOPIDOGREL BISULFATE 300 MG PO TABS
300.0000 mg | ORAL_TABLET | Freq: Once | ORAL | Status: AC
  Administered 2018-04-08: 300 mg via ORAL
  Filled 2018-04-08: qty 1

## 2018-04-08 MED ORDER — IOHEXOL 350 MG/ML SOLN
100.0000 mL | Freq: Once | INTRAVENOUS | Status: AC | PRN
  Administered 2018-04-08: 75 mL via INTRAVENOUS

## 2018-04-08 NOTE — ED Notes (Signed)
ED TO INPATIENT HANDOFF REPORT  ED Nurse Name and Phone #: [phone] Daryl Eastern Name/Age/Gender Joseph Pitts 73 y.o. male Room/Bed: 034C/034C  Code Status Full  Home/SNF/Other Home Patient oriented to: self, place, time and situation Is this baseline? Yes   Triage Complete: Triage complete  Chief Complaint stroke sx  Triage Note 73yo male arriving to [facility] via private vehicle at 1104. Patient was LKW at 0800 when he was going to pilates with his wife when he had difficulty reading. He describes that words disappeared as he tried to read the lines. Symptoms lasted for about 2 hours. Patient presented to the ED where a code stroke was called  Allergies Allergies  Allergen Reactions  . Sulfa Antibiotics     Unknown.  "Mother told me not to take"    Level of Care/Admitting Diagnosis ED Disposition    ED Disposition Condition [place]: [place] [100100]  Level of Care: Telemetry Medical [104]  I expect the patient will be discharged within 24 hours: Yes  LOW acuity---Tx typically complete <24 hrs---ACUTE conditions typically can be evaluated <24 hours---LABS likely to return to acceptable levels <24 hours---IS near functional baseline---EXPECTED to return to current living arrangement---NOT newly hypoxic: Meets criteria for 5C-Observation unit  Diagnosis: TIA (transient ischemic attack) [962229]  Admitting Physician: Karmen Bongo [[id number]]  Attending Physician: Karmen Bongo [[id number]]  PT Class (Do Not Modify): Observation [104]  PT Acc Code (Do Not Modify): Observation [10022]       B Medical/Surgery History Past Medical History:  Diagnosis Date  . Cancer ([place])    basal cell on nose removed  . Diverticulosis   . Hyperlipidemia   . Tubular adenoma of colon 2016  . Vascular occlusion    retinal vein occlusion   Past Surgical History:  Procedure Laterality Date  . COLONOSCOPY    . TONSILLECTOMY       A IV  Location/Drains/Wounds Patient Lines/Drains/Airways Status   Active Line/Drains/Airways    Name:   Placement date:   Placement time:   Site:   Days:   Peripheral IV 04/08/18 Right Antecubital   04/08/18    1136    Antecubital   less than 1          Intake/Output Last 24 hours No intake or output data in the 24 hours ending 04/08/18 1445  Labs/Imaging Results for orders placed or performed during the hospital encounter of 04/08/18 (from the past 48 hour(s))  CBG monitoring, ED     Status: Abnormal   Collection Time: 04/08/18 11:10 AM  Result Value Ref Range   Glucose-Capillary 104 (H) 70 - 99 mg/dL   Comment 1 Notify RN    Comment 2 Document in Chart   Ethanol     Status: None   Collection Time: 04/08/18 11:26 AM  Result Value Ref Range   Alcohol, Ethyl (B) <10 <10 mg/dL    Comment: (NOTE) Lowest detectable limit for serum alcohol is 10 mg/dL. For medical purposes only. Performed at [facility], [place] [address], [place], [place] [address]   Protime-INR     Status: None   Collection Time: 04/08/18 11:26 AM  Result Value Ref Range   Prothrombin Time 13.3 11.4 - 15.2 seconds   INR 1.0 0.8 - 1.2    Comment: (NOTE) INR goal varies based on device and disease states. Performed at [facility], [place] [address] [place], [place] [address]  APTT     Status: Abnormal   Collection Time: 04/08/18 11:26 AM  Result Value Ref Range   aPTT 41 (H) 24 - 36 seconds    Comment:        IF BASELINE aPTT IS ELEVATED, SUGGEST PATIENT RISK ASSESSMENT BE USED TO DETERMINE APPROPRIATE ANTICOAGULANT THERAPY. Performed at [facility], [place] [address] [place], [place] [address]   CBC     Status: None   Collection Time: 04/08/18 11:26 AM  Result Value Ref Range   WBC 6.0 4.0 - 10.5 K/uL   RBC 5.04 4.22 - 5.81 MIL/uL   Hemoglobin 15.0 13.0 - 17.0 g/dL   HCT 44.9 39.0 - 52.0 %   MCV 89.1 80.0 - 100.0 fL   MCH 29.8 26.0 - 34.0 pg   MCHC 33.4 30.0 - 36.0 g/dL   RDW 13.0  11.5 - 15.5 %   Platelets 246 150 - 400 K/uL   nRBC 0.0 0.0 - 0.2 %    Comment: Performed at [facility], [place] [address], [place], [place] [address]  Differential     Status: None   Collection Time: 04/08/18 11:26 AM  Result Value Ref Range   Neutrophils Relative % 56 %   Neutro Abs 3.4 1.7 - 7.7 K/uL   Lymphocytes Relative 35 %   Lymphs Abs 2.1 0.7 - 4.0 K/uL   Monocytes Relative 7 %   Monocytes Absolute 0.4 0.1 - 1.0 K/uL   Eosinophils Relative 2 %   Eosinophils Absolute 0.1 0.0 - 0.5 K/uL   Basophils Relative 0 %   Basophils Absolute 0.0 0.0 - 0.1 K/uL   Immature Granulocytes 0 %   Abs Immature Granulocytes 0.01 0.00 - 0.07 K/uL    Comment: Performed at [facility], [place] [address] [place], [place] [address]  Comprehensive metabolic panel     Status: Abnormal   Collection Time: 04/08/18 11:26 AM  Result Value Ref Range   Sodium 137 135 - 145 mmol/L   Potassium 4.3 3.5 - 5.1 mmol/L   Chloride 103 98 - 111 mmol/L   CO2 25 22 - 32 mmol/L   Glucose, Bld 109 (H) 70 - 99 mg/dL   BUN 24 (H) 8 - 23 mg/dL   Creatinine, Ser 1.10 0.61 - 1.24 mg/dL   Calcium 9.4 8.9 - 10.3 mg/dL   Total Protein 6.4 (L) 6.5 - 8.1 g/dL   Albumin 4.0 3.5 - 5.0 g/dL   AST 34 15 - 41 U/L   ALT 28 0 - 44 U/L   Alkaline Phosphatase 103 38 - 126 U/L   Total Bilirubin 0.9 0.3 - 1.2 mg/dL   GFR calc non Af Amer >60 >60 mL/min   GFR calc Af Amer >60 >60 mL/min   Anion gap 9 5 - 15    Comment: Performed at [facility] [address] [place], [place] [address]  I-stat Creatinine, ED     Status: None   Collection Time: 04/08/18 11:38 AM  Result Value Ref Range   Creatinine, Ser 1.00 0.61 - 1.24 mg/dL  Urine rapid drug screen (hosp performed)     Status: None   Collection Time: 04/08/18 12:21 PM  Result Value Ref Range   Opiates NONE DETECTED NONE DETECTED   Cocaine NONE DETECTED NONE DETECTED   Benzodiazepines NONE DETECTED NONE DETECTED   Amphetamines NONE DETECTED NONE DETECTED    Tetrahydrocannabinol NONE DETECTED NONE DETECTED   Barbiturates NONE DETECTED NONE  DETECTED    Comment: (NOTE) DRUG SCREEN FOR MEDICAL PURPOSES ONLY.  IF CONFIRMATION IS NEEDED FOR ANY PURPOSE, NOTIFY LAB WITHIN 5 DAYS. LOWEST DETECTABLE LIMITS FOR URINE DRUG SCREEN Drug Class                     Cutoff (ng/mL) Amphetamine and metabolites    1000 Barbiturate and metabolites    200 Benzodiazepine                 876 Tricyclics and metabolites     300 Opiates and metabolites        300 Cocaine and metabolites        300 THC                            50 Performed at [facility], [place] [address], [place], [place] [address]   Urinalysis, Routine w reflex microscopic     Status: None   Collection Time: 04/08/18 12:21 PM  Result Value Ref Range   Color, Urine YELLOW YELLOW   APPearance CLEAR CLEAR   Specific Gravity, Urine 1.020 1.005 - 1.030   pH 6.0 5.0 - 8.0   Glucose, UA NEGATIVE NEGATIVE mg/dL   Hgb urine dipstick NEGATIVE NEGATIVE   Bilirubin Urine NEGATIVE NEGATIVE   Ketones, ur NEGATIVE NEGATIVE mg/dL   Protein, ur NEGATIVE NEGATIVE mg/dL   Nitrite NEGATIVE NEGATIVE   Leukocytes,Ua NEGATIVE NEGATIVE    Comment: Performed at [facility] [address], [place], [place] [address]   Ct Angio Head W Or Wo Contrast  Result Date: 04/08/2018 CLINICAL DATA:  73 year old male code stroke. Right facial droop. EXAM: CT ANGIOGRAPHY HEAD AND NECK TECHNIQUE: Multidetector CT imaging of the head and neck was performed using the standard protocol during bolus administration of intravenous contrast. Multiplanar CT image reconstructions and MIPs were obtained to evaluate the vascular anatomy. Carotid stenosis measurements (when applicable) are obtained utilizing NASCET criteria, using the distal internal carotid diameter as the denominator. CONTRAST:  10mL OMNIPAQUE IOHEXOL 350 MG/ML SOLN COMPARISON:  Head CT without contrast 1121 hours today. FINDINGS: CTA NECK Skeleton: Cervical  spine degeneration. Heterogeneous sclerosis in the visible upper thoracic spine is nonspecific. No destructive osseous lesion identified. Calvarium bone mineralization appears normal. Upper chest: Negative. Other neck: Negative. Aortic arch: 3 vessel arch configuration. Mild arch and great vessel origin atherosclerosis. Right carotid system: No brachiocephalic or right CCA origin stenosis. No right CCA stenosis. Negative right carotid bifurcation and cervical right ICA. Left carotid system: No left CCA origin stenosis. Mild calcified plaque at the left ICA origin and bulb. No stenosis. Tortuous but otherwise negative cervical left ICA. Vertebral arteries: No proximal right subclavian artery stenosis. Normal right vertebral artery origin. Patent right vertebral artery to the skull base without stenosis. No proximal left CCA stenosis despite plaque. Soft and calcified plaque near the left vertebral artery origin, but only mild stenosis (series 8, image 114). Otherwise patent left vertebral artery to the skull base without stenosis. CTA HEAD Posterior circulation: Mildly dominant distal left vertebral artery. No distal vertebral stenosis. Patent PICA origins and vertebrobasilar junction. Patent basilar artery without stenosis. Normal SCA and PCA origins. Left posterior communicating artery is present, the right is diminutive or absent. Bilateral PCA branches are within normal limits. Anterior circulation: Both ICA siphons are patent. The left siphon appears dominant owing to dominance of the left A1. The left siphon is patent  without stenosis despite mild to moderate calcified plaque. Small infundibulum at the left posterior communicating artery origin. On the right the siphon is patent with mild supraclinoid calcified plaque but no stenosis. The left A1 is dominant, the right is diminutive or absent. Anterior communicating artery is normal. The left ACA A2 is also dominant simulating azygos type anatomy. ACA branches  are within normal limits. Left MCA M1 bifurcates early without stenosis. Left MCA M2 segments are patent without stenosis. No left MCA branch occlusion is identified. Right MCA M1 and bifurcation are patent without stenosis. Right MCA branches are within normal limits. Venous sinuses: Patent. Anatomic variants: Dominant left ACA. Subsequent mildly dominant left ICA. Mildly dominant left vertebral artery. Review of the MIP images confirms the above findings IMPRESSION: 1. Negative for large vessel occlusion. This was discussed by telephone with Dr. Roland Rack by Dr. Jola Baptist on 04/08/2018 at 1135 hours 2. Mild for age atherosclerosis with no significant arterial stenosis in the head or neck. 3. Incidental finding of heterogeneous bone mineralization in the thoracic spine which may be degenerative but is incompletely visualized. If there is thoracic back pain recommend further imaging with either Thoracic Spine MRI or CT (noncontrast may suffice). This was also communicated to Dr. Leonel Ramsay at 11:51 am on 04/08/2018 by text page via the [facility] messaging system. Electronically Signed   By: Genevie Ann M.D.   On: 04/08/2018 11:51   Ct Angio Neck W Or Wo Contrast  Result Date: 04/08/2018 CLINICAL DATA:  73 year old male code stroke. Right facial droop. EXAM: CT ANGIOGRAPHY HEAD AND NECK TECHNIQUE: Multidetector CT imaging of the head and neck was performed using the standard protocol during bolus administration of intravenous contrast. Multiplanar CT image reconstructions and MIPs were obtained to evaluate the vascular anatomy. Carotid stenosis measurements (when applicable) are obtained utilizing NASCET criteria, using the distal internal carotid diameter as the denominator. CONTRAST:  43mL OMNIPAQUE IOHEXOL 350 MG/ML SOLN COMPARISON:  Head CT without contrast 1121 hours today. FINDINGS: CTA NECK Skeleton: Cervical spine degeneration. Heterogeneous sclerosis in the visible upper thoracic spine is nonspecific. No  destructive osseous lesion identified. Calvarium bone mineralization appears normal. Upper chest: Negative. Other neck: Negative. Aortic arch: 3 vessel arch configuration. Mild arch and great vessel origin atherosclerosis. Right carotid system: No brachiocephalic or right CCA origin stenosis. No right CCA stenosis. Negative right carotid bifurcation and cervical right ICA. Left carotid system: No left CCA origin stenosis. Mild calcified plaque at the left ICA origin and bulb. No stenosis. Tortuous but otherwise negative cervical left ICA. Vertebral arteries: No proximal right subclavian artery stenosis. Normal right vertebral artery origin. Patent right vertebral artery to the skull base without stenosis. No proximal left CCA stenosis despite plaque. Soft and calcified plaque near the left vertebral artery origin, but only mild stenosis (series 8, image 114). Otherwise patent left vertebral artery to the skull base without stenosis. CTA HEAD Posterior circulation: Mildly dominant distal left vertebral artery. No distal vertebral stenosis. Patent PICA origins and vertebrobasilar junction. Patent basilar artery without stenosis. Normal SCA and PCA origins. Left posterior communicating artery is present, the right is diminutive or absent. Bilateral PCA branches are within normal limits. Anterior circulation: Both ICA siphons are patent. The left siphon appears dominant owing to dominance of the left A1. The left siphon is patent without stenosis despite mild to moderate calcified plaque. Small infundibulum at the left posterior communicating artery origin. On the right the siphon is patent with mild supraclinoid calcified plaque but no  stenosis. The left A1 is dominant, the right is diminutive or absent. Anterior communicating artery is normal. The left ACA A2 is also dominant simulating azygos type anatomy. ACA branches are within normal limits. Left MCA M1 bifurcates early without stenosis. Left MCA M2 segments are  patent without stenosis. No left MCA branch occlusion is identified. Right MCA M1 and bifurcation are patent without stenosis. Right MCA branches are within normal limits. Venous sinuses: Patent. Anatomic variants: Dominant left ACA. Subsequent mildly dominant left ICA. Mildly dominant left vertebral artery. Review of the MIP images confirms the above findings IMPRESSION: 1. Negative for large vessel occlusion. This was discussed by telephone with Dr. Roland Rack by Dr. Jola Baptist on 04/08/2018 at 1135 hours 2. Mild for age atherosclerosis with no significant arterial stenosis in the head or neck. 3. Incidental finding of heterogeneous bone mineralization in the thoracic spine which may be degenerative but is incompletely visualized. If there is thoracic back pain recommend further imaging with either Thoracic Spine MRI or CT (noncontrast may suffice). This was also communicated to Dr. Leonel Ramsay at 11:51 am on 04/08/2018 by text page via the [facility] messaging system. Electronically Signed   By: Genevie Ann M.D.   On: 04/08/2018 11:51   Ct Head Code Stroke Wo Contrast  Result Date: 04/08/2018 CLINICAL DATA:  Code stroke.  RIGHT facial droop. TIA, initial exam. EXAM: CT HEAD WITHOUT CONTRAST TECHNIQUE: Contiguous axial images were obtained from the base of the skull through the vertex without intravenous contrast. COMPARISON:  None. FINDINGS: Brain: No evidence of acute infarction, hemorrhage, hydrocephalus, extra-axial collection or mass lesion/mass effect. Mild atrophy. Hypoattenuation of white matter, likely small vessel disease. Suspected chronic RIGHT cerebellar infarct. Vascular: Calcification of the cavernous internal carotid arteries consistent with cerebrovascular atherosclerotic disease. No signs of intracranial large vessel occlusion. Skull: Calvarium intact. Sinuses/Orbits: No acute finding. Other: None. ASPECTS [facility] Stroke Program Early CT Score) - Ganglionic level infarction (caudate, lentiform  nuclei, internal capsule, insula, M1-M3 cortex): 7 - Supraganglionic infarction (M4-M6 cortex): 3 Total score (0-10 with 10 being normal): 10 IMPRESSION: 1. Atrophy and small vessel disease. No acute intracranial findings. 2. ASPECTS is 10. * These results were communicated to Dr. Leonel Ramsay at 11:30 amon 04/08/2018 Electronically Signed   By: Staci Righter M.D.   On: 04/08/2018 11:41    Pending Labs Unresulted Labs (From admission, onward)   None      Vitals/Pain Today's Vitals   04/08/18 1230 04/08/18 1245 04/08/18 1415 04/08/18 1430  BP:      Pulse: (!) 56 (!) 56    Resp: 16 15 16 18   Temp:      TempSrc:      SpO2: 98% 98%    Weight:      Height:      PainSc:        Isolation Precautions No active isolations  Medications Medications  iohexol (OMNIPAQUE) 350 MG/ML injection 100 mL (75 mLs Intravenous Contrast Given 04/08/18 1134)    Mobility walks Low fall risk   Focused Assessments Neuro Assessment Handoff:  Swallow screen pass? Yes    NIH Stroke Scale ( + Modified Stroke Scale Criteria)  Interval: Initial Level of Consciousness (1a.)   : Alert, keenly responsive LOC Questions (1b. )   +: Answers both questions correctly LOC Commands (1c. )   + : Performs both tasks correctly Best Gaze (2. )  +: Normal Visual (3. )  +: No visual loss Facial Palsy (4. )    :  Minor paralysis Motor Arm, Left (5a. )   +: No drift Motor Arm, Right (5b. )   +: No drift Motor Leg, Left (6a. )   +: No drift Motor Leg, Right (6b. )   +: No drift Limb Ataxia (7. ): Absent Sensory (8. )   +: Normal, no sensory loss Best Language (9. )   +: No aphasia Dysarthria (10. ): Normal Extinction/Inattention (11.)   +: No Abnormality Modified SS Total  +: 0 Complete NIHSS TOTAL: 1 Last date known well: 04/08/18 Last time known well: 0800 Neuro Assessment: Within Defined Limits Neuro Checks:   Initial (04/08/18 1115)  Last Documented NIHSS Modified Score: 0 (04/08/18 1400) Has TPA been  given? No If patient is a Neuro Trauma and patient is going to OR before floor call report to [place] nurse: [phone] or [phone]     R Recommendations: See Admitting Provider Note  Report given to: [facility]  Additional Notes:  No weakness on exam. Speech is clear and pt' symptoms have resolved. Pt labs unremarkable.

## 2018-04-08 NOTE — Code Documentation (Signed)
73yo male arriving to [facility] via private vehicle at 1104. Patient was LKW at 0800 when he was going to pilates with his wife when he had difficulty reading. He describes that words disappeared as he tried to read the lines. Symptoms lasted for about 2 hours. Patient presented to the ED where a code stroke was called. Stroke team met the patient at the bridge. Patient transported to CT. CT followed by CTA completed. NIHSS 1, see documentation for details and code stroke times. Patient with slight right nasolabial fold flattening on exam. No acute stroke treatment at this time d/t mild symptoms. Bedside handoff with ED RN Debe Coder.

## 2018-04-08 NOTE — H&P (Signed)
History and Physical    Joseph Pitts DTO:[id number] DOB: 09/22/1945 DOA: 04/08/2018  PCP: Lajean Manes, MD Consultants:  Urology Patient coming from:  Home - lives with wife; NOK: Wife, [phone]  Chief Complaint: Code stroke  HPI: Joseph Pitts is a 73 y.o. male with medical history significant of HLD (on Lipitor 3 x/week) and L retinal vein occlusion several years ago presenting with code stroke.  This morning, everything was fine for about an hour.  He was looking at a text and wasn't able to see or read the words.  About 15 minutes later, he had difficulty reading out loud for about 10 minutes.  He felt out of sorts for 1-2 hours.  Symptoms completely resolved.  He had a similar episode 6-7 years ago - returning from the gym and had aphasia for about 10 minutes and it resolved.  No dizziness, dysphagia, n/w/t.  Previously on ASA (mother died of CAD in her early 80s), stopped about 1-2 years ago.     ED Course:   TIA - reading this AM and unable to read and pronounce words.  +visual disturbance.  Symptoms resolved within a few minutes.  Code stroke called.  Imaging ok.  Neurology recommends TIA observation.  Review of Systems: As per HPI; otherwise review of systems reviewed and negative.   Ambulatory Status:  Ambulates without assistance  Past Medical History:  Diagnosis Date   Cancer ([place])    basal cell on nose removed   Diverticulosis    Hyperlipidemia    Tubular adenoma of colon 2016   Vascular occlusion    retinal vein occlusion    Past Surgical History:  Procedure Laterality Date   COLONOSCOPY     TONSILLECTOMY      Social History   Socioeconomic History   Marital status: Divorced    Spouse name: Not on file   Number of children: Not on file   Years of education: Not on file   Highest education level: Not on file  Occupational History   Occupation: retired  Scientist, product/process development strain: Not on file   Food insecurity:   Worry: Not on file    Inability: Not on Lexicographer needs:    Medical: Not on file    Non-medical: Not on file  Tobacco Use   Smoking status: Former Smoker    Types: Cigarettes    Last attempt to quit: 1967    Years since quitting: 53.2   Smokeless tobacco: Never Used  Substance and Sexual Activity   Alcohol use: Yes    Alcohol/week: 7.0 standard drinks    Types: 7 Cans of beer per week   Drug use: No   Sexual activity: Not on file  Lifestyle   Physical activity:    Days per week: Not on file    Minutes per session: Not on file   Stress: Not on file  Relationships   Social connections:    Talks on phone: Not on file    Gets together: Not on file    Attends religious service: Not on file    Active member of club or organization: Not on file    Attends meetings of clubs or organizations: Not on file    Relationship status: Not on file   Intimate partner violence:    Fear of current or ex partner: Not on file    Emotionally abused: Not on file    Physically abused: Not on file  Forced sexual activity: Not on file  Other Topics Concern   Not on file  Social History Narrative   Not on file    Allergies  Allergen Reactions   Sulfa Antibiotics     Unknown.  "Mother told me not to take"    Family History  Problem Relation Age of Onset   Heart disease Mother    Prostate cancer Father    Colon cancer Neg Hx    Esophageal cancer Neg Hx    Rectal cancer Neg Hx    Stomach cancer Neg Hx     Prior to Admission medications   Medication Sig Start Date End Date Taking? Authorizing Provider  aspirin 81 MG tablet Take 81 mg by mouth daily.   Yes [provider]  atorvastatin (LIPITOR) 40 MG tablet Take 1 tablet (40 mg total) by mouth daily. 02/09/14  Yes Darlyne Russian, MD  Cholecalciferol (VITAMIN D3) 25 MCG (1000 UT) CAPS Take 1,000 Units by mouth daily.   Yes [provider]  Multiple Vitamin (MULTIVITAMIN) tablet Take 1  tablet by mouth daily.   Yes [provider]  OVER THE COUNTER MEDICATION Take 1 tablet by mouth daily. Prostate plus health complex.   Yes [provider]  diclofenac sodium (VOLTAREN) 1 % GEL Apply 2 g topically 4 (four) times daily. Patient not taking: Reported on 04/08/2018 02/05/17   Stefanie Libel, MD    Physical Exam: Vitals:   04/08/18 1415 04/08/18 1430 04/08/18 1500 04/08/18 1519  BP:    (!) 152/82  Pulse:    (!) 57  Resp: 16 18 17    Temp:    98.5 F (36.9 C)  TempSrc:    Oral  SpO2:    98%  Weight:      Height:          General:  Appears calm and comfortable and is NAD  Eyes:  PERRL, EOMI, normal lids, iris  ENT:  grossly normal hearing, lips & tongue, mmm  Neck:  no LAD, masses or thyromegaly; no carotid bruits  Cardiovascular:  RRR, no m/r/g. No LE edema.   Respiratory:   CTA bilaterally with no wheezes/rales/rhonchi.  Normal respiratory effort.  Abdomen:  soft, NT, ND, NABS  Skin:  no rash or induration seen on limited exam  Musculoskeletal:  grossly normal tone BUE/BLE, good ROM, no bony abnormality  Psychiatric:  grossly normal mood and affect, speech fluent and appropriate, AOx3  Neurologic:  CN 2-12 grossly intact, moves all extremities in coordinated fashion, sensation intact    Radiological Exams on Admission: Ct Angio Head W Or Wo Contrast  Result Date: 04/08/2018 CLINICAL DATA:  73 year old male code stroke. Right facial droop. EXAM: CT ANGIOGRAPHY HEAD AND NECK TECHNIQUE: Multidetector CT imaging of the head and neck was performed using the standard protocol during bolus administration of intravenous contrast. Multiplanar CT image reconstructions and MIPs were obtained to evaluate the vascular anatomy. Carotid stenosis measurements (when applicable) are obtained utilizing NASCET criteria, using the distal internal carotid diameter as the denominator. CONTRAST:  16mL OMNIPAQUE IOHEXOL 350 MG/ML SOLN COMPARISON:  Head CT without  contrast 1121 hours today. FINDINGS: CTA NECK Skeleton: Cervical spine degeneration. Heterogeneous sclerosis in the visible upper thoracic spine is nonspecific. No destructive osseous lesion identified. Calvarium bone mineralization appears normal. Upper chest: Negative. Other neck: Negative. Aortic arch: 3 vessel arch configuration. Mild arch and great vessel origin atherosclerosis. Right carotid system: No brachiocephalic or right CCA origin stenosis. No right CCA stenosis.  Negative right carotid bifurcation and cervical right ICA. Left carotid system: No left CCA origin stenosis. Mild calcified plaque at the left ICA origin and bulb. No stenosis. Tortuous but otherwise negative cervical left ICA. Vertebral arteries: No proximal right subclavian artery stenosis. Normal right vertebral artery origin. Patent right vertebral artery to the skull base without stenosis. No proximal left CCA stenosis despite plaque. Soft and calcified plaque near the left vertebral artery origin, but only mild stenosis (series 8, image 114). Otherwise patent left vertebral artery to the skull base without stenosis. CTA HEAD Posterior circulation: Mildly dominant distal left vertebral artery. No distal vertebral stenosis. Patent PICA origins and vertebrobasilar junction. Patent basilar artery without stenosis. Normal SCA and PCA origins. Left posterior communicating artery is present, the right is diminutive or absent. Bilateral PCA branches are within normal limits. Anterior circulation: Both ICA siphons are patent. The left siphon appears dominant owing to dominance of the left A1. The left siphon is patent without stenosis despite mild to moderate calcified plaque. Small infundibulum at the left posterior communicating artery origin. On the right the siphon is patent with mild supraclinoid calcified plaque but no stenosis. The left A1 is dominant, the right is diminutive or absent. Anterior communicating artery is normal. The left ACA  A2 is also dominant simulating azygos type anatomy. ACA branches are within normal limits. Left MCA M1 bifurcates early without stenosis. Left MCA M2 segments are patent without stenosis. No left MCA branch occlusion is identified. Right MCA M1 and bifurcation are patent without stenosis. Right MCA branches are within normal limits. Venous sinuses: Patent. Anatomic variants: Dominant left ACA. Subsequent mildly dominant left ICA. Mildly dominant left vertebral artery. Review of the MIP images confirms the above findings IMPRESSION: 1. Negative for large vessel occlusion. This was discussed by telephone with Dr. Roland Rack by Dr. Jola Baptist on 04/08/2018 at 1135 hours 2. Mild for age atherosclerosis with no significant arterial stenosis in the head or neck. 3. Incidental finding of heterogeneous bone mineralization in the thoracic spine which may be degenerative but is incompletely visualized. If there is thoracic back pain recommend further imaging with either Thoracic Spine MRI or CT (noncontrast may suffice). This was also communicated to Dr. Leonel Ramsay at 11:51 am on 04/08/2018 by text page via the [facility] messaging system. Electronically Signed   By: Genevie Ann M.D.   On: 04/08/2018 11:51   Ct Angio Neck W Or Wo Contrast  Result Date: 04/08/2018 CLINICAL DATA:  73 year old male code stroke. Right facial droop. EXAM: CT ANGIOGRAPHY HEAD AND NECK TECHNIQUE: Multidetector CT imaging of the head and neck was performed using the standard protocol during bolus administration of intravenous contrast. Multiplanar CT image reconstructions and MIPs were obtained to evaluate the vascular anatomy. Carotid stenosis measurements (when applicable) are obtained utilizing NASCET criteria, using the distal internal carotid diameter as the denominator. CONTRAST:  98mL OMNIPAQUE IOHEXOL 350 MG/ML SOLN COMPARISON:  Head CT without contrast 1121 hours today. FINDINGS: CTA NECK Skeleton: Cervical spine degeneration. Heterogeneous  sclerosis in the visible upper thoracic spine is nonspecific. No destructive osseous lesion identified. Calvarium bone mineralization appears normal. Upper chest: Negative. Other neck: Negative. Aortic arch: 3 vessel arch configuration. Mild arch and great vessel origin atherosclerosis. Right carotid system: No brachiocephalic or right CCA origin stenosis. No right CCA stenosis. Negative right carotid bifurcation and cervical right ICA. Left carotid system: No left CCA origin stenosis. Mild calcified plaque at the left ICA origin and bulb. No stenosis. Tortuous but otherwise  negative cervical left ICA. Vertebral arteries: No proximal right subclavian artery stenosis. Normal right vertebral artery origin. Patent right vertebral artery to the skull base without stenosis. No proximal left CCA stenosis despite plaque. Soft and calcified plaque near the left vertebral artery origin, but only mild stenosis (series 8, image 114). Otherwise patent left vertebral artery to the skull base without stenosis. CTA HEAD Posterior circulation: Mildly dominant distal left vertebral artery. No distal vertebral stenosis. Patent PICA origins and vertebrobasilar junction. Patent basilar artery without stenosis. Normal SCA and PCA origins. Left posterior communicating artery is present, the right is diminutive or absent. Bilateral PCA branches are within normal limits. Anterior circulation: Both ICA siphons are patent. The left siphon appears dominant owing to dominance of the left A1. The left siphon is patent without stenosis despite mild to moderate calcified plaque. Small infundibulum at the left posterior communicating artery origin. On the right the siphon is patent with mild supraclinoid calcified plaque but no stenosis. The left A1 is dominant, the right is diminutive or absent. Anterior communicating artery is normal. The left ACA A2 is also dominant simulating azygos type anatomy. ACA branches are within normal limits. Left MCA  M1 bifurcates early without stenosis. Left MCA M2 segments are patent without stenosis. No left MCA branch occlusion is identified. Right MCA M1 and bifurcation are patent without stenosis. Right MCA branches are within normal limits. Venous sinuses: Patent. Anatomic variants: Dominant left ACA. Subsequent mildly dominant left ICA. Mildly dominant left vertebral artery. Review of the MIP images confirms the above findings IMPRESSION: 1. Negative for large vessel occlusion. This was discussed by telephone with Dr. Roland Rack by Dr. Jola Baptist on 04/08/2018 at 1135 hours 2. Mild for age atherosclerosis with no significant arterial stenosis in the head or neck. 3. Incidental finding of heterogeneous bone mineralization in the thoracic spine which may be degenerative but is incompletely visualized. If there is thoracic back pain recommend further imaging with either Thoracic Spine MRI or CT (noncontrast may suffice). This was also communicated to Dr. Leonel Ramsay at 11:51 am on 04/08/2018 by text page via the [facility] messaging system. Electronically Signed   By: Genevie Ann M.D.   On: 04/08/2018 11:51   Ct Head Code Stroke Wo Contrast  Result Date: 04/08/2018 CLINICAL DATA:  Code stroke.  RIGHT facial droop. TIA, initial exam. EXAM: CT HEAD WITHOUT CONTRAST TECHNIQUE: Contiguous axial images were obtained from the base of the skull through the vertex without intravenous contrast. COMPARISON:  None. FINDINGS: Brain: No evidence of acute infarction, hemorrhage, hydrocephalus, extra-axial collection or mass lesion/mass effect. Mild atrophy. Hypoattenuation of white matter, likely small vessel disease. Suspected chronic RIGHT cerebellar infarct. Vascular: Calcification of the cavernous internal carotid arteries consistent with cerebrovascular atherosclerotic disease. No signs of intracranial large vessel occlusion. Skull: Calvarium intact. Sinuses/Orbits: No acute finding. Other: None. ASPECTS [facility] Stroke Program Early  CT Score) - Ganglionic level infarction (caudate, lentiform nuclei, internal capsule, insula, M1-M3 cortex): 7 - Supraganglionic infarction (M4-M6 cortex): 3 Total score (0-10 with 10 being normal): 10 IMPRESSION: 1. Atrophy and small vessel disease. No acute intracranial findings. 2. ASPECTS is 10. * These results were communicated to Dr. Leonel Ramsay at 11:30 amon 04/08/2018 Electronically Signed   By: Staci Righter M.D.   On: 04/08/2018 11:41    EKG: Independently reviewed.  NSR with rate 66; nonspecific ST changes with no evidence of acute ischemia   Labs on Admission: I have personally reviewed the available labs and imaging studies at the  time of the admission.  Pertinent labs:   Glucose 109 BUN 24/Creatinine 1.10/GFR >60 Normal CBC ETOH negative INR 1.0 UDS negative UA WNL   Assessment/Plan Principal Problem:   TIA (transient ischemic attack) Active Problems:   Dyslipidemia   Retinal hemorrhage of left eye   TIA with h/o retinal vein occlusion -Patient who is generally quite healthy but with h/o retinal vein occlusion presenting with aphasia and dysarthria earlier today -Symptoms were short-lived and have resolved completely -Concerning for TIA/CVA -Will place in observation status for CVA/TIA evaluation -Telemetry monitoring -MRI -CTA head/neck -Echo -Risk stratification with FLP, A1c; will also check TSH -ASA daily - he has not been taking this medication for the last 1-2 years; neurology is recommending DAPT x 3 weeks -Neurology consult -No PT/OT/ST consults for now since symptoms have completely resolved  HTN -Allow permissive HTN for now -Treat BP only if >220/120, and then with goal of 15% reduction   HLD -Check FLP -Patient has been taking Lipitor 40 mg every other day, but this should be given daily       DVT prophylaxis:  Lovenox  Code Status: Full - confirmed with patient/family Family Communication: Wife, son present throughout  evaluation Disposition Plan:  Home once clinically improved Consults called: Neurology; Nutrition  Admission status: It is my clinical opinion that referral for OBSERVATION is reasonable and necessary in this patient based on the above information provided. The aforementioned taken together are felt to place the patient at high risk for further clinical deterioration. However it is anticipated that the patient may be medically stable for discharge from the hospital within 24 to 48 hours.   Karmen Bongo MD [facility]   How to contact the [facility] Attending or Consulting provider [place] or covering provider during after hours [place], for this patient?  1. Check the care team in [facility] and look for a) attending/consulting [facility] provider listed and b) the [facility] team listed 2. Log into [email or web address] and use [place]'s universal password to access. If you do not have the password, please contact the hospital operator. 3. Locate the [facility] provider you are looking for under [facility] and page to a number that you can be directly reached. 4. If you still have difficulty reaching the provider, please page the [facility] (Director on Call) for the Hospitalists listed on amion for assistance.   04/08/2018, 6:01 PM

## 2018-04-08 NOTE — Consult Note (Signed)
Neurology Consultation Reason for Consult: TIA Referring Physician: Thomasenia Bottoms  CC: Difficulty reading  History is obtained from: Patient  HPI: Joseph Pitts is a 73 y.o. male with a history of hyperlipidemia who presents with transient difficulty reading that started earlier today.  He states that when he first awoke he had no problems, but then subsequently around 8 AM he started noticing that he was having difficulty reading.  He states that he would see part of a word, or part of letters but would not be able to see the whole letters.  His wife states that he did not seem to have any difficulty with speech, nor did he slurred his words or have any weakness or numbness.   Of note, 6 years ago he had an episode of transient difficulty speaking which was not worked up.  3 years ago he had a retinal artery occlusion which was treated by ophthalmology, but no clear embolic work-up is been undertaken.  LKW: 8 AM tpa given?: no, resolution of symptoms  ROS: A 14 point ROS was performed and is negative except as noted in the HPI.   Past Medical History:  Diagnosis Date  . Cancer ([place])    basal cell on nose removed  . Diverticulosis   . Hyperlipidemia   . Tubular adenoma of colon 2016  . Vascular occlusion    retinal vein occlusion     Family History  Problem Relation Age of Onset  . Heart disease Mother   . Prostate cancer Father   . Colon cancer Neg Hx   . Esophageal cancer Neg Hx   . Rectal cancer Neg Hx   . Stomach cancer Neg Hx      Social History:  reports that he quit smoking about 53 years ago. His smoking use included cigarettes. He has never used smokeless tobacco. He reports current alcohol use of about 7.0 standard drinks of alcohol per week. He reports that he does not use drugs.   Exam: Current vital signs: BP (!) 152/82 (BP Location: Left Arm)   Pulse (!) 57   Temp 98.5 F (36.9 C) (Oral)   Resp 17   Ht 5\' 8"  (1.727 m)   Wt 64.3 kg   SpO2 98%   BMI  21.55 kg/m  Vital signs in last 24 hours: Temp:  [98.5 F (36.9 C)-98.6 F (37 C)] 98.5 F (36.9 C) (03/17 1519) Pulse Rate:  [56-64] 57 (03/17 1519) Resp:  [10-18] 17 (03/17 1500) BP: (150-157)/(77-82) 152/82 (03/17 1519) SpO2:  [98 %-99 %] 98 % (03/17 1519) Weight:  [64.3 kg] 64.3 kg (03/17 1100)   Physical Exam  Constitutional: Appears well-developed and well-nourished.  Psych: Affect appropriate to situation Eyes: No scleral injection HENT: No OP obstrucion Head: Normocephalic.  Cardiovascular: Normal rate and regular rhythm.  Respiratory: Effort normal, non-labored breathing GI: Soft.  No distension. There is no tenderness.  Skin: WDI  Neuro: Mental Status: Patient is awake, alert, oriented to person, place, month, year, and situation. Patient is able to give a clear and coherent history. No signs of aphasia or neglect Is able to read without difficulty Cranial Nerves: II: Visual Fields are full. Pupils are equal, round, and reactive to light.   III,IV, VI: EOMI without ptosis or diploplia.  V: Facial sensation is symmetric to temperature VII: Facial movement with mild flattening of the right nasolabial fold.  VIII: hearing is intact to voice X: Uvula elevates symmetrically XI: Shoulder shrug is symmetric. XII: tongue is  midline without atrophy or fasciculations.  Motor: Tone is normal. Bulk is normal. 5/5 strength was present in all four extremities.  Sensory: Sensation is symmetric to light touch and temperature in the arms and legs. Cerebellar: FNF and HKS are intact bilaterally   I have reviewed labs in epic and the results pertinent to this consultation are: CMP-unremarkable  I have reviewed the images obtained: CT head-unremarkable, CTA-no LVO  Impression: 73 year old male with transient visual field deficit versus reading deficit.  Recommendations: - HgbA1c, fasting lipid panel - MRI of the brain without contrast - Frequent neuro checks -  Echocardiogram - Prophylactic therapy-Antiplatelet med: Aspirin -81 mg daily and Plavix 75 mg daily following 300 mg load for 3 weeks followed by monotherapy - Risk factor modification - Telemetry monitoring - PT consult, OT consult, Speech consult - Stroke team to follow    Roland Rack, MD [facility] [phone]  If 7pm- 7am, please page neurology on call as listed in [place].

## 2018-04-08 NOTE — ED Notes (Signed)
Doug([facility]/Activate Code Stroke called @ 1112)-per Dr. Jaynie Crumble by Levada Dy

## 2018-04-08 NOTE — ED Triage Notes (Signed)
73yo male arriving to [facility] via private vehicle. Patient was LKW at 0800 when he was going to workout with his wife when he had difficulty reading and some difficulty speaking. Symptoms lasted for about 2 hours before arrival to the ED. Patient presented to the ED where a code stroke was called .

## 2018-04-08 NOTE — ED Provider Notes (Signed)
[facility] Emergency Department Provider Note MRN:  [id number]  Arrival date & time: 04/08/18     Chief Complaint   Code Stroke   History of Present Illness   Joseph Pitts is a 73 y.o. year-old male with a history of hyperlipidemia presenting to the ED with chief complaint of code stroke.  Sudden onset inability to read or forming words this morning, 2 to 3 hours prior to arrival.  Denies numbness or weakness to the arms or legs.  Symptoms have all since resolved.  Review of Systems  Positive for strokelike symptoms.  Patient's Health History    Past Medical History:  Diagnosis Date  . Cancer ([place])    basal cell on nose removed  . Diverticulosis   . Hyperlipidemia   . Tubular adenoma of colon 2016  . Vascular occlusion    retinal vein occlusion    Past Surgical History:  Procedure Laterality Date  . COLONOSCOPY    . TONSILLECTOMY      Family History  Problem Relation Age of Onset  . Heart disease Mother   . Prostate cancer Father   . Colon cancer Neg Hx   . Esophageal cancer Neg Hx   . Rectal cancer Neg Hx   . Stomach cancer Neg Hx     Social History   Socioeconomic History  . Marital status: Divorced    Spouse name: Not on file  . Number of children: Not on file  . Years of education: Not on file  . Highest education level: Not on file  Occupational History  . Occupation: retired  Scientific laboratory technician  . Financial resource strain: Not on file  . Food insecurity:    Worry: Not on file    Inability: Not on file  . Transportation needs:    Medical: Not on file    Non-medical: Not on file  Tobacco Use  . Smoking status: Former Smoker    Types: Cigarettes    Last attempt to quit: 1967    Years since quitting: 53.2  . Smokeless tobacco: Never Used  Substance and Sexual Activity  . Alcohol use: Yes    Alcohol/week: 7.0 standard drinks    Types: 7 Cans of beer per week  . Drug use: No  . Sexual activity: Not on file  Lifestyle  .  Physical activity:    Days per week: Not on file    Minutes per session: Not on file  . Stress: Not on file  Relationships  . Social connections:    Talks on phone: Not on file    Gets together: Not on file    Attends religious service: Not on file    Active member of club or organization: Not on file    Attends meetings of clubs or organizations: Not on file    Relationship status: Not on file  . Intimate partner violence:    Fear of current or ex partner: Not on file    Emotionally abused: Not on file    Physically abused: Not on file    Forced sexual activity: Not on file  Other Topics Concern  . Not on file  Social History Narrative  . Not on file     Physical Exam  Vital Signs and Nursing Notes reviewed Vitals:   04/08/18 1500 04/08/18 1519  BP:  (!) 152/82  Pulse:  (!) 57  Resp: 17   Temp:  98.5 F (36.9 C)  SpO2:  98%  CONSTITUTIONAL: Well-appearing, NAD NEURO:  Alert and oriented x 3, normal and symmetric strength and sensation, no visual field cuts, no nystagmus, no aphasia, no neglect EYES:  eyes equal and reactive ENT/NECK:  no LAD, no JVD CARDIO: Regular rate, well-perfused, normal S1 and S2 PULM:  CTAB no wheezing or rhonchi GI/GU:  normal bowel sounds, non-distended, non-tender MSK/SPINE:  No gross deformities, no edema SKIN:  no rash, atraumatic PSYCH:  Appropriate speech and behavior  Diagnostic and Interventional Summary    EKG Interpretation  Date/Time:  Tuesday April 08 2018 11:37:11 EDT Ventricular Rate:  66 PR Interval:    QRS Duration: 94 QT Interval:  393 QTC Calculation: 412 R Axis:   -39 Text Interpretation:  Sinus rhythm Left axis deviation Anterior infarct, old Confirmed by Gerlene Fee [phone]) on 04/08/2018 12:19:14 PM      Labs Reviewed  APTT - Abnormal; Notable for the following components:      Result Value   aPTT 41 (*)    All other components within normal limits  COMPREHENSIVE METABOLIC PANEL - Abnormal; Notable for  the following components:   Glucose, Bld 109 (*)    BUN 24 (*)    Total Protein 6.4 (*)    All other components within normal limits  CBG MONITORING, ED - Abnormal; Notable for the following components:   Glucose-Capillary 104 (*)    All other components within normal limits  ETHANOL  PROTIME-INR  CBC  DIFFERENTIAL  RAPID URINE DRUG SCREEN, HOSP PERFORMED  URINALYSIS, ROUTINE W REFLEX MICROSCOPIC  I-STAT CREATININE, ED    CT ANGIO HEAD W OR WO CONTRAST  Final Result    CT ANGIO NECK W OR WO CONTRAST  Final Result    CT HEAD CODE STROKE WO CONTRAST  Final Result    MR BRAIN WO CONTRAST    (Results Pending)    Medications  aspirin chewable tablet 81 mg (has no administration in time range)  atorvastatin (LIPITOR) tablet 40 mg (has no administration in time range)  enoxaparin (LOVENOX) injection 40 mg (has no administration in time range)   stroke: mapping our early stages of recovery book (has no administration in time range)  0.9 %  sodium chloride infusion (has no administration in time range)  acetaminophen (TYLENOL) tablet 650 mg (has no administration in time range)    Or  acetaminophen (TYLENOL) solution 650 mg (has no administration in time range)    Or  acetaminophen (TYLENOL) suppository 650 mg (has no administration in time range)  senna-docusate (Senokot-S) tablet 1 tablet (has no administration in time range)  iohexol (OMNIPAQUE) 350 MG/ML injection 100 mL (75 mLs Intravenous Contrast Given 04/08/18 1134)     Procedures Critical Care Critical Care Documentation Critical care time provided by me (excluding procedures): 32 minutes  Condition necessitating critical care: Concern for acute ischemic stroke  Components of critical care management: reviewing of prior records, laboratory and imaging interpretation, frequent re-examination and reassessment of vital signs, initiation of code stroke protocol, discussion with consulting services    ED Course and  Medical Decision Making  I have reviewed the triage vital signs and the nursing notes.  Pertinent labs & imaging results that were available during my care of the patient were reviewed by me and considered in my medical decision making (see below for details).  Consistent with TIA, awaiting formal neurology recommendations but would anticipate MRI and admission.  Admitted to hospital service for further care.      Barth Kirks. Dasan Hardman,  MD [place] [email or web address]  Final Clinical Impressions(s) / ED Diagnoses     ICD-10-CM   1. TIA (transient ischemic attack) G45.9     ED Discharge Orders    None         Maudie Flakes, MD 04/08/18 [phone]

## 2018-04-08 NOTE — Procedures (Signed)
Echo attempted. Patient being transported to MRI. Will attempt again later. 

## 2018-04-09 ENCOUNTER — Observation Stay (HOSPITAL_COMMUNITY): Payer: Medicare Other

## 2018-04-09 DIAGNOSIS — G459 Transient cerebral ischemic attack, unspecified: Secondary | ICD-10-CM | POA: Diagnosis not present

## 2018-04-09 DIAGNOSIS — Z87891 Personal history of nicotine dependence: Secondary | ICD-10-CM | POA: Diagnosis not present

## 2018-04-09 DIAGNOSIS — I10 Essential (primary) hypertension: Secondary | ICD-10-CM | POA: Diagnosis not present

## 2018-04-09 DIAGNOSIS — E785 Hyperlipidemia, unspecified: Secondary | ICD-10-CM

## 2018-04-09 LAB — LIPID PANEL
CHOLESTEROL: 152 mg/dL (ref 0–200)
HDL: 58 mg/dL (ref >40)
LDL Cholesterol: 86 mg/dL (ref 0–99)
Total CHOL/HDL Ratio: 2.6 RATIO
Triglycerides: 38 mg/dL (ref <150)
VLDL: 8 mg/dL (ref 0–40)

## 2018-04-09 LAB — HEMOGLOBIN A1C
HEMOGLOBIN A1C: 5.3 % (ref 4.8–5.6)
Mean Plasma Glucose: 105.41 mg/dL

## 2018-04-09 MED ORDER — ATORVASTATIN CALCIUM 40 MG PO TABS: 40.0000 mg | ORAL_TABLET | Freq: Every day | ORAL | 0 refills | Status: AC

## 2018-04-09 MED ORDER — SODIUM CHLORIDE 0.9 % IV SOLN: Freq: Once | INTRAVENOUS | Status: DC

## 2018-04-09 MED ORDER — CLOPIDOGREL BISULFATE 75 MG PO TABS: 75.0000 mg | ORAL_TABLET | Freq: Every day | ORAL | 0 refills | Status: DC

## 2018-04-09 MED ORDER — IBUPROFEN 200 MG PO TABS
200.0000 mg | ORAL_TABLET | Freq: Once | ORAL | Status: AC
  Administered 2018-04-09: 200 mg via ORAL
  Filled 2018-04-09: qty 1

## 2018-04-09 NOTE — Care Management Obs Status (Signed)
[place] NOTIFICATION   Patient Details  Name: Joseph Pitts MRN: [id number] Date of Birth: May 19, 1945   Medicare Observation Status Notification Given:  Yes    Midge Minium RN, BSN, NCM-BC, ACM-RN [phone] 04/09/2018, 4:21 PM

## 2018-04-09 NOTE — Discharge Summary (Signed)
Joseph Pitts, is a 73 y.o. male  DOB March 23, 1945  MRN [id number].  Admission date:  04/08/2018  Admitting Physician  Karmen Bongo, MD  Discharge Date:  04/09/2018   Primary MD  Lajean Manes, MD  Recommendations for primary care physician for things to follow:     Official read of echocardiogram  Discharge Diagnosis    Principal Problem:   TIA (transient ischemic attack) Active Problems:   Dyslipidemia   Retinal hemorrhage of left eye   Essential hypertension   History of tobacco use      Past Medical History:  Diagnosis Date  . Cancer ([place])    basal cell on nose removed  . Diverticulosis   . Hyperlipidemia   . Tubular adenoma of colon 2016  . Vascular occlusion    retinal vein occlusion    Past Surgical History:  Procedure Laterality Date  . COLONOSCOPY    . TONSILLECTOMY         HPI  from the history and physical done on the day of admission:    Joseph Pitts is a 73 y.o. male with medical history significant of HLD (on Lipitor 3 x/week) and L retinal vein occlusion several years ago presenting with code stroke.  This morning, everything was fine for about an hour.  He was looking at a text and wasn't able to see or read the words.  About 15 minutes later, he had difficulty reading out loud for about 10 minutes.  He felt out of sorts for 1-2 hours.  Symptoms completely resolved.  He had a similar episode 6-7 years ago - returning from the gym and had aphasia for about 10 minutes and it resolved.  No dizziness, dysphagia, n/w/t.  Previously on ASA (mother died of CAD in her early 36s), stopped about 1-2 years ago.     ED Course:   TIA - reading this AM and unable to read and pronounce words.  +visual disturbance.  Symptoms resolved within a few minutes.  Code stroke called.  Imaging ok.  Neurology recommends TIA observation.    Hospital Course:    1. Transient ischemic attack: Patient presents with transient vision loss and difficulty reading.  Previous history of TIA several years ago.  Urine drug screen was negative.  CT angiogram of the head and neck showed no significant area of infarction or stenosis.  Neurology was consulted.  MRI of the brain negative for any acute abnormalities.  Hemoglobin A1c -5.3 and TSH -1.705.  Neurology recommended patient to be on 3  weeks of dual antiplatelet therapy, and then continue on aspirin alone.  Echocardiogram preliminary reported to showed normal LV function with no significant wall motion abnormalities.  Official read was still pending.  Noted to have an episode of PVCs overnight on telemetry.  Referrals placed for patient to be set up with outpatient loop recorder by Dr. Einar Gip as question atrial fibrillation with history of palpitations is reasonable cause of symptoms.   2. Essential hypertension: On admission blood pressures elevated up to 157/81.  Patient was not on any blood pressure lowering medications at home.  Blood pressures now 120/69.  Recommend outpatient follow-up to determine if patient needs to be on oral medications for blood pressure control.  3. Hyperlipidemia: Total cholesterol 152, HDL 58, LDL 86, and triglycerides 38.  Goal LDL less than 70.  Patient was taking Lipitor 3 times a week at home.  Recommended increasing Lipitor to 40 mg daily as LDL not at goal.  4. Remote history of tobacco use: Patient quit over 53 years ago.     Follow UP  Follow-up Information    [facility] Follow up in 4 week(s).   Specialty:  Neurology Why:  stroke clinic. office will call with appt date and time Contact information: [address] [place] [phone]       Adrian Prows, MD Follow up.   Specialty:  Cardiology Why:  for loop recorder consideration. office will call  with appt date and time Contact information: [place] [address] [phone]        Lajean Manes, MD. Schedule an appointment as soon as possible for a visit.   Specialty:  Internal Medicine Why:  Call and make appointment within 7 to 10 days Contact information: [address] [place] [address] [phone]            Consults obtained : Neurology  Discharge Condition: Good  Diet and Activity recommendation: See Discharge Instructions below   Discharge Instructions    Ambulatory referral to Cardiology   Complete by:  As directed    For loop consideration following TIA with 2 additional previous embolic events   Ambulatory referral to Neurology   Complete by:  As directed    Follow up with stroke clinic NP (Jessica Vanschaick or Cecille Rubin, if both not available, consider Dr. Antony Contras, Dr. Bess Harvest, or Dr. Sarina Ill) at [facility] in about 4 weeks.   Discharge instructions   Complete by:  As directed    Follow with Primary MD Lajean Manes, MD within 7 to 10 days.  Your presenting symptoms were thought to be related to a transient ischemic attack/TIA further information to be provided.  Recommended that you be on aspirin and Plavix for 3 weeks, and then continue on aspirin alone.  Please monitor blood pressure at least once daily and keep a log to show to your primary care provider during your next office visit.  You will be called with appointments to follow-up with [facility] as well as Dr. Einar Gip.  -( we routinely change or add medications that can affect your baseline labs and fluid status, therefore we recommend that you get the mentioned basic workup next visit with your PCP, your PCP may decide not to get them or add new tests based on their clinical decision)  Activity: As tolerated  Disposition: Home   Diet: Heart Healthy   Special Instructions: If you  have  smoked or chewed Tobacco  in the last 2 yrs please stop smoking, stop any regular Alcohol  and or any Recreational drug use.  On your next visit with your primary care physician please Get Medicines reviewed and adjusted.  Please request your Lajean Manes, MD to go over all Hospital Tests and Procedure/Radiological results at the follow up, please get all Hospital records sent to your Prim MD by signing hospital release before you go home.  If you experience worsening of your admission symptoms, develop shortness of breath, life threatening emergency, suicidal or homicidal thoughts you must seek medical attention immediately by calling 911 or calling your MD immediately  if symptoms less severe.  You Must read complete instructions/literature along with all the possible adverse reactions/side effects for all the Medicines you take and that have been prescribed to you. Take any new Medicines after you have completely understood and accpet all the possible adverse reactions/side effects.   Do not drive, operate heavy machinery, perform activities at heights, swimming or participation in water activities or provide baby sitting services if your were admitted for syncope or siezures until you have seen by Primary MD or a Neurologist and advised to do so again.  Do not drive when taking Pain medications.  Do not take more than prescribed Pain, Sleep and Anxiety Medications  Wear Seat belts while driving.   Please note  You were cared for by a hospitalist during your hospital stay. If you have any questions about your discharge medications or the care you received while you were in the hospital after you are discharged, you can call the unit and asked to speak with the hospitalist on call if the hospitalist that took care of you is not available. Once you are discharged, your primary care physician will handle any further medical issues. Please note that NO REFILLS for any discharge medications will  be authorized once you are discharged, as it is imperative that you return to your primary care physician (or establish a relationship with a primary care physician if you do not have one) for your aftercare needs so that they can reassess your need for medications and monitor your lab values.        Discharge Medications     Allergies as of 04/09/2018      Reactions   Sulfa Antibiotics    Unknown.  "Mother told me not to take"      Medication List    TAKE these medications   aspirin 81 MG tablet Take 81 mg by mouth daily.   atorvastatin 40 MG tablet Commonly known as:  LIPITOR Take 1 tablet (40 mg total) by mouth daily.   clopidogrel 75 MG tablet Commonly known as:  PLAVIX Take 1 tablet (75 mg total) by mouth daily. Start taking on:  April 10, 2018   multivitamin tablet Take 1 tablet by mouth daily.   OVER THE COUNTER MEDICATION Take 1 tablet by mouth daily. Prostate plus health complex.   Vitamin D3 25 MCG (1000 UT) Caps Take 1,000 Units by mouth daily.       Major procedures and Radiology Reports - PLEASE review detailed and final reports for all details, in brief -   Echocardiogram-official read pending   Ct Angio Head W Or Wo Contrast  Result Date: 04/08/2018 CLINICAL DATA:  73 year old male code stroke. Right facial droop. EXAM: CT ANGIOGRAPHY HEAD AND NECK TECHNIQUE: Multidetector CT imaging of the head and neck was performed using the standard  protocol during bolus administration of intravenous contrast. Multiplanar CT image reconstructions and MIPs were obtained to evaluate the vascular anatomy. Carotid stenosis measurements (when applicable) are obtained utilizing NASCET criteria, using the distal internal carotid diameter as the denominator. CONTRAST:  22mL OMNIPAQUE IOHEXOL 350 MG/ML SOLN COMPARISON:  Head CT without contrast 1121 hours today. FINDINGS: CTA NECK Skeleton: Cervical spine degeneration. Heterogeneous sclerosis in the visible upper thoracic  spine is nonspecific. No destructive osseous lesion identified. Calvarium bone mineralization appears normal. Upper chest: Negative. Other neck: Negative. Aortic arch: 3 vessel arch configuration. Mild arch and great vessel origin atherosclerosis. Right carotid system: No brachiocephalic or right CCA origin stenosis. No right CCA stenosis. Negative right carotid bifurcation and cervical right ICA. Left carotid system: No left CCA origin stenosis. Mild calcified plaque at the left ICA origin and bulb. No stenosis. Tortuous but otherwise negative cervical left ICA. Vertebral arteries: No proximal right subclavian artery stenosis. Normal right vertebral artery origin. Patent right vertebral artery to the skull base without stenosis. No proximal left CCA stenosis despite plaque. Soft and calcified plaque near the left vertebral artery origin, but only mild stenosis (series 8, image 114). Otherwise patent left vertebral artery to the skull base without stenosis. CTA HEAD Posterior circulation: Mildly dominant distal left vertebral artery. No distal vertebral stenosis. Patent PICA origins and vertebrobasilar junction. Patent basilar artery without stenosis. Normal SCA and PCA origins. Left posterior communicating artery is present, the right is diminutive or absent. Bilateral PCA branches are within normal limits. Anterior circulation: Both ICA siphons are patent. The left siphon appears dominant owing to dominance of the left A1. The left siphon is patent without stenosis despite mild to moderate calcified plaque. Small infundibulum at the left posterior communicating artery origin. On the right the siphon is patent with mild supraclinoid calcified plaque but no stenosis. The left A1 is dominant, the right is diminutive or absent. Anterior communicating artery is normal. The left ACA A2 is also dominant simulating azygos type anatomy. ACA branches are within normal limits. Left MCA M1 bifurcates early without stenosis.  Left MCA M2 segments are patent without stenosis. No left MCA branch occlusion is identified. Right MCA M1 and bifurcation are patent without stenosis. Right MCA branches are within normal limits. Venous sinuses: Patent. Anatomic variants: Dominant left ACA. Subsequent mildly dominant left ICA. Mildly dominant left vertebral artery. Review of the MIP images confirms the above findings IMPRESSION: 1. Negative for large vessel occlusion. This was discussed by telephone with Dr. Roland Rack by Dr. Jola Baptist on 04/08/2018 at 1135 hours 2. Mild for age atherosclerosis with no significant arterial stenosis in the head or neck. 3. Incidental finding of heterogeneous bone mineralization in the thoracic spine which may be degenerative but is incompletely visualized. If there is thoracic back pain recommend further imaging with either Thoracic Spine MRI or CT (noncontrast may suffice). This was also communicated to Dr. Leonel Ramsay at 11:51 am on 04/08/2018 by text page via the [facility] messaging system. Electronically Signed   By: Genevie Ann M.D.   On: 04/08/2018 11:51   Ct Angio Neck W Or Wo Contrast  Result Date: 04/08/2018 CLINICAL DATA:  73 year old male code stroke. Right facial droop. EXAM: CT ANGIOGRAPHY HEAD AND NECK TECHNIQUE: Multidetector CT imaging of the head and neck was performed using the standard protocol during bolus administration of intravenous contrast. Multiplanar CT image reconstructions and MIPs were obtained to evaluate the vascular anatomy. Carotid stenosis measurements (when applicable) are obtained utilizing NASCET criteria, using  the distal internal carotid diameter as the denominator. CONTRAST:  41mL OMNIPAQUE IOHEXOL 350 MG/ML SOLN COMPARISON:  Head CT without contrast 1121 hours today. FINDINGS: CTA NECK Skeleton: Cervical spine degeneration. Heterogeneous sclerosis in the visible upper thoracic spine is nonspecific. No destructive osseous lesion identified. Calvarium bone mineralization  appears normal. Upper chest: Negative. Other neck: Negative. Aortic arch: 3 vessel arch configuration. Mild arch and great vessel origin atherosclerosis. Right carotid system: No brachiocephalic or right CCA origin stenosis. No right CCA stenosis. Negative right carotid bifurcation and cervical right ICA. Left carotid system: No left CCA origin stenosis. Mild calcified plaque at the left ICA origin and bulb. No stenosis. Tortuous but otherwise negative cervical left ICA. Vertebral arteries: No proximal right subclavian artery stenosis. Normal right vertebral artery origin. Patent right vertebral artery to the skull base without stenosis. No proximal left CCA stenosis despite plaque. Soft and calcified plaque near the left vertebral artery origin, but only mild stenosis (series 8, image 114). Otherwise patent left vertebral artery to the skull base without stenosis. CTA HEAD Posterior circulation: Mildly dominant distal left vertebral artery. No distal vertebral stenosis. Patent PICA origins and vertebrobasilar junction. Patent basilar artery without stenosis. Normal SCA and PCA origins. Left posterior communicating artery is present, the right is diminutive or absent. Bilateral PCA branches are within normal limits. Anterior circulation: Both ICA siphons are patent. The left siphon appears dominant owing to dominance of the left A1. The left siphon is patent without stenosis despite mild to moderate calcified plaque. Small infundibulum at the left posterior communicating artery origin. On the right the siphon is patent with mild supraclinoid calcified plaque but no stenosis. The left A1 is dominant, the right is diminutive or absent. Anterior communicating artery is normal. The left ACA A2 is also dominant simulating azygos type anatomy. ACA branches are within normal limits. Left MCA M1 bifurcates early without stenosis. Left MCA M2 segments are patent without stenosis. No left MCA branch occlusion is identified.  Right MCA M1 and bifurcation are patent without stenosis. Right MCA branches are within normal limits. Venous sinuses: Patent. Anatomic variants: Dominant left ACA. Subsequent mildly dominant left ICA. Mildly dominant left vertebral artery. Review of the MIP images confirms the above findings IMPRESSION: 1. Negative for large vessel occlusion. This was discussed by telephone with Dr. Roland Rack by Dr. Jola Baptist on 04/08/2018 at 1135 hours 2. Mild for age atherosclerosis with no significant arterial stenosis in the head or neck. 3. Incidental finding of heterogeneous bone mineralization in the thoracic spine which may be degenerative but is incompletely visualized. If there is thoracic back pain recommend further imaging with either Thoracic Spine MRI or CT (noncontrast may suffice). This was also communicated to Dr. Leonel Ramsay at 11:51 am on 04/08/2018 by text page via the [facility] messaging system. Electronically Signed   By: Genevie Ann M.D.   On: 04/08/2018 11:51   Mr Brain Wo Contrast  Result Date: 04/08/2018 CLINICAL DATA:  Initial evaluation for acute difficulty reading. EXAM: MRI HEAD WITHOUT CONTRAST TECHNIQUE: Multiplanar, multiecho pulse sequences of the brain and surrounding structures were obtained without intravenous contrast. COMPARISON:  Prior CT and CTA from the same day. FINDINGS: Brain: Cerebral volume within normal limits for age. Minimal scattered T2/FLAIR hyperintensities within the supratentorial cerebral white matter, nonspecific, but felt to be within normal limits for age. No abnormal foci of restricted diffusion to suggest acute or subacute ischemia. Gray-white matter differentiation maintained. No encephalomalacia to suggest chronic infarction. No foci of susceptibility  artifact to suggest acute or chronic intracranial hemorrhage. No mass lesion, midline shift or mass effect. No hydrocephalus. No extra-axial fluid collection. Pituitary gland within normal. Vascular: Major intracranial  vascular flow voids maintained. Skull and upper cervical spine: Craniocervical junction within normal limits. Mild degenerative spondylolysis noted within the upper cervical spine without significant stenosis. Bone marrow signal intensity normal. No scalp soft tissue abnormality. Sinuses/Orbits: Lobes in orbital soft tissues within normal limits. Mild-to-moderate mucosal thickening throughout the paranasal sinuses. No mastoid effusion. Other: None. IMPRESSION: Normal brain MRI for age. No acute intracranial abnormality identified. Electronically Signed   By: Jeannine Boga M.D.   On: 04/08/2018 18:06   Ct Head Code Stroke Wo Contrast  Result Date: 04/08/2018 CLINICAL DATA:  Code stroke.  RIGHT facial droop. TIA, initial exam. EXAM: CT HEAD WITHOUT CONTRAST TECHNIQUE: Contiguous axial images were obtained from the base of the skull through the vertex without intravenous contrast. COMPARISON:  None. FINDINGS: Brain: No evidence of acute infarction, hemorrhage, hydrocephalus, extra-axial collection or mass lesion/mass effect. Mild atrophy. Hypoattenuation of white matter, likely small vessel disease. Suspected chronic RIGHT cerebellar infarct. Vascular: Calcification of the cavernous internal carotid arteries consistent with cerebrovascular atherosclerotic disease. No signs of intracranial large vessel occlusion. Skull: Calvarium intact. Sinuses/Orbits: No acute finding. Other: None. ASPECTS [facility] Stroke Program Early CT Score) - Ganglionic level infarction (caudate, lentiform nuclei, internal capsule, insula, M1-M3 cortex): 7 - Supraganglionic infarction (M4-M6 cortex): 3 Total score (0-10 with 10 being normal): 10 IMPRESSION: 1. Atrophy and small vessel disease. No acute intracranial findings. 2. ASPECTS is 10. * These results were communicated to Dr. Leonel Ramsay at 11:30 amon 04/08/2018 Electronically Signed   By: Staci Righter M.D.   On: 04/08/2018 11:41    Micro Results     No results found for  this or any previous visit (from the past 240 hour(s)).     Today   Subjective    Remington Highbaugh today has had somewhat of a posterior headache.  Denies any recurrence of symptoms of vision loss or difficulty reading.   Objective   Blood pressure (!) 146/75, pulse (!) 56, temperature 98.1 F (36.7 C), temperature source Oral, resp. rate 18, height 5\' 8"  (1.727 m), weight 64.3 kg, SpO2 97 %.   Intake/Output Summary (Last 24 hours) at 04/09/2018 1625 Last data filed at 04/09/2018 0600 Gross per 24 hour  Intake 585.09 ml  Output -  Net 585.09 ml    Exam  Constitutional: Elderly male in  NAD, calm, comfortable Eyes: PERRL, lids and conjunctivae normal ENMT: Mucous membranes are moist. Posterior pharynx clear of any exudate or lesions. Neck: normal, supple, no masses, no thyromegaly Respiratory: clear to auscultation bilaterally, no wheezing, no crackles. Normal respiratory effort. No accessory muscle use.  Cardiovascular: Regular rate and rhythm, no murmurs / rubs / gallops. No extremity edema. 2+ pedal pulses. No carotid bruits.  Abdomen: no tenderness, no masses palpated. No hepatosplenomegaly. Bowel sounds positive.  Musculoskeletal: no clubbing / cyanosis. No joint deformity upper and lower extremities. Good ROM, no contractures. Normal muscle tone.  Skin: no rashes, lesions, ulcers. No induration Neurologic: CN 2-12 grossly intact. Sensation intact, DTR normal. Strength 5/5 in all 4.  Psychiatric: Normal judgment and insight. Alert and oriented x 3. Normal mood.    Data Review   CBC w Diff:  Lab Results  Component Value Date   WBC 6.0 04/08/2018   HGB 15.0 04/08/2018   HCT 44.9 04/08/2018   PLT 246 04/08/2018  LYMPHOPCT 35 04/08/2018   MONOPCT 7 04/08/2018   EOSPCT 2 04/08/2018   BASOPCT 0 04/08/2018    CMP:  Lab Results  Component Value Date   NA 137 04/08/2018   K 4.3 04/08/2018   CL 103 04/08/2018   CO2 25 04/08/2018   BUN 24 (H) 04/08/2018    CREATININE 1.00 04/08/2018   CREATININE 1.04 02/09/2014   PROT 6.4 (L) 04/08/2018   ALBUMIN 4.0 04/08/2018   BILITOT 0.9 04/08/2018   ALKPHOS 103 04/08/2018   AST 34 04/08/2018   ALT 28 04/08/2018  .   Total Time in preparing paper work, data evaluation and todays exam - 35 minutes  Norval Morton M.D on 04/09/2018 at [facility]   Office  [phone]

## 2018-04-09 NOTE — Progress Notes (Signed)
  Echocardiogram 2D Echocardiogram has been performed.  Joseph Pitts 04/09/2018, 11:25 AM

## 2018-04-09 NOTE — Progress Notes (Addendum)
STROKE TEAM PROGRESS NOTE   INTERVAL HISTORY His wife is at the bedside.  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 He remains stable without further deficits. Reviewed history of present illness in detail with the patient. he has prior history of TIA with vision loss but currently was not on aspirin    Vitals:   04/09/18 0008 04/09/18 0241 04/09/18 0446 04/09/18 0816  BP: 139/78 130/75 (!) 141/77 (!) 148/83  Pulse: 64 63 66 65  Resp: 18 18 18 16   Temp: 97.9 F (36.6 C) 98.3 F (36.8 C) 98.5 F (36.9 C) 98 F (36.7 C)  TempSrc: Oral Oral Oral Oral  SpO2: 96% 95% 97% 97%  Weight:      Height:        CBC:  Recent Labs  Lab 04/08/18 1126  WBC 6.0  NEUTROABS 3.4  HGB 15.0  HCT 44.9  MCV 89.1  PLT 465    Basic Metabolic Panel:  Recent Labs  Lab 04/08/18 1126 04/08/18 1138  NA 137  --   K 4.3  --   CL 103  --   CO2 25  --   GLUCOSE 109*  --   BUN 24*  --   CREATININE 1.10 1.00  CALCIUM 9.4  --    Lipid Panel:     Component Value Date/Time   CHOL 152 04/09/2018 0248   TRIG 38 04/09/2018 0248   HDL 58 04/09/2018 0248   CHOLHDL 2.6 04/09/2018 0248   VLDL 8 04/09/2018 0248   LDLCALC 86 04/09/2018 0248   HgbA1c:  Lab Results  Component Value Date   HGBA1C 5.3 04/09/2018   Urine Drug Screen:     Component Value Date/Time   LABOPIA NONE DETECTED 04/08/2018 1221   COCAINSCRNUR NONE DETECTED 04/08/2018 1221   LABBENZ NONE DETECTED 04/08/2018 1221   AMPHETMU NONE DETECTED 04/08/2018 1221   THCU NONE DETECTED 04/08/2018 1221   LABBARB NONE DETECTED 04/08/2018 1221    Alcohol Level     Component Value Date/Time   ETH <10 04/08/2018 1126    IMAGING Ct Angio Head W Or Wo Contrast  Result Date: 04/08/2018 CLINICAL DATA:  73 year old male code stroke. Right facial droop. EXAM: CT ANGIOGRAPHY HEAD AND NECK TECHNIQUE: Multidetector CT imaging of the head and neck was performed using the standard protocol during  bolus administration of intravenous contrast. Multiplanar CT image reconstructions and MIPs were obtained to evaluate the vascular anatomy. Carotid stenosis measurements (when applicable) are obtained utilizing NASCET criteria, using the distal internal carotid diameter as the denominator. CONTRAST:  49mL OMNIPAQUE IOHEXOL 350 MG/ML SOLN COMPARISON:  Head CT without contrast 1121 hours today. FINDINGS: CTA NECK Skeleton: Cervical spine degeneration. Heterogeneous sclerosis in the visible upper thoracic spine is nonspecific. No destructive osseous lesion identified. Calvarium bone mineralization appears normal. Upper chest: Negative. Other neck: Negative. Aortic arch: 3 vessel arch configuration. Mild arch and great vessel origin atherosclerosis. Right carotid system: No brachiocephalic or right CCA origin stenosis. No right CCA stenosis. Negative right carotid bifurcation and cervical right ICA. Left carotid system: No left CCA origin stenosis. Mild calcified plaque at the left ICA origin and bulb. No stenosis. Tortuous but otherwise negative cervical left ICA. Vertebral arteries: No proximal right subclavian artery stenosis. Normal right vertebral artery origin. Patent right vertebral artery to the skull base without stenosis. No proximal left CCA stenosis despite plaque. Soft and calcified plaque near the left vertebral artery origin,  but only mild stenosis (series 8, image 114). Otherwise patent left vertebral artery to the skull base without stenosis. CTA HEAD Posterior circulation: Mildly dominant distal left vertebral artery. No distal vertebral stenosis. Patent PICA origins and vertebrobasilar junction. Patent basilar artery without stenosis. Normal SCA and PCA origins. Left posterior communicating artery is present, the right is diminutive or absent. Bilateral PCA branches are within normal limits. Anterior circulation: Both ICA siphons are patent. The left siphon appears dominant owing to dominance of the  left A1. The left siphon is patent without stenosis despite mild to moderate calcified plaque. Small infundibulum at the left posterior communicating artery origin. On the right the siphon is patent with mild supraclinoid calcified plaque but no stenosis. The left A1 is dominant, the right is diminutive or absent. Anterior communicating artery is normal. The left ACA A2 is also dominant simulating azygos type anatomy. ACA branches are within normal limits. Left MCA M1 bifurcates early without stenosis. Left MCA M2 segments are patent without stenosis. No left MCA branch occlusion is identified. Right MCA M1 and bifurcation are patent without stenosis. Right MCA branches are within normal limits. Venous sinuses: Patent. Anatomic variants: Dominant left ACA. Subsequent mildly dominant left ICA. Mildly dominant left vertebral artery. Review of the MIP images confirms the above findings IMPRESSION: 1. Negative for large vessel occlusion. This was discussed by telephone with Dr. Roland Rack by Dr. Jola Baptist on 04/08/2018 at 1135 hours 2. Mild for age atherosclerosis with no significant arterial stenosis in the head or neck. 3. Incidental finding of heterogeneous bone mineralization in the thoracic spine which may be degenerative but is incompletely visualized. If there is thoracic back pain recommend further imaging with either Thoracic Spine MRI or CT (noncontrast may suffice). This was also communicated to Dr. Leonel Ramsay at 11:51 am on 04/08/2018 by text page via the [facility] messaging system. Electronically Signed   By: Genevie Ann M.D.   On: 04/08/2018 11:51   Ct Angio Neck W Or Wo Contrast  Result Date: 04/08/2018 CLINICAL DATA:  73 year old male code stroke. Right facial droop. EXAM: CT ANGIOGRAPHY HEAD AND NECK TECHNIQUE: Multidetector CT imaging of the head and neck was performed using the standard protocol during bolus administration of intravenous contrast. Multiplanar CT image reconstructions and MIPs were  obtained to evaluate the vascular anatomy. Carotid stenosis measurements (when applicable) are obtained utilizing NASCET criteria, using the distal internal carotid diameter as the denominator. CONTRAST:  8mL OMNIPAQUE IOHEXOL 350 MG/ML SOLN COMPARISON:  Head CT without contrast 1121 hours today. FINDINGS: CTA NECK Skeleton: Cervical spine degeneration. Heterogeneous sclerosis in the visible upper thoracic spine is nonspecific. No destructive osseous lesion identified. Calvarium bone mineralization appears normal. Upper chest: Negative. Other neck: Negative. Aortic arch: 3 vessel arch configuration. Mild arch and great vessel origin atherosclerosis. Right carotid system: No brachiocephalic or right CCA origin stenosis. No right CCA stenosis. Negative right carotid bifurcation and cervical right ICA. Left carotid system: No left CCA origin stenosis. Mild calcified plaque at the left ICA origin and bulb. No stenosis. Tortuous but otherwise negative cervical left ICA. Vertebral arteries: No proximal right subclavian artery stenosis. Normal right vertebral artery origin. Patent right vertebral artery to the skull base without stenosis. No proximal left CCA stenosis despite plaque. Soft and calcified plaque near the left vertebral artery origin, but only mild stenosis (series 8, image 114). Otherwise patent left vertebral artery to the skull base without stenosis. CTA HEAD Posterior circulation: Mildly dominant distal left vertebral artery. No distal  vertebral stenosis. Patent PICA origins and vertebrobasilar junction. Patent basilar artery without stenosis. Normal SCA and PCA origins. Left posterior communicating artery is present, the right is diminutive or absent. Bilateral PCA branches are within normal limits. Anterior circulation: Both ICA siphons are patent. The left siphon appears dominant owing to dominance of the left A1. The left siphon is patent without stenosis despite mild to moderate calcified plaque.  Small infundibulum at the left posterior communicating artery origin. On the right the siphon is patent with mild supraclinoid calcified plaque but no stenosis. The left A1 is dominant, the right is diminutive or absent. Anterior communicating artery is normal. The left ACA A2 is also dominant simulating azygos type anatomy. ACA branches are within normal limits. Left MCA M1 bifurcates early without stenosis. Left MCA M2 segments are patent without stenosis. No left MCA branch occlusion is identified. Right MCA M1 and bifurcation are patent without stenosis. Right MCA branches are within normal limits. Venous sinuses: Patent. Anatomic variants: Dominant left ACA. Subsequent mildly dominant left ICA. Mildly dominant left vertebral artery. Review of the MIP images confirms the above findings IMPRESSION: 1. Negative for large vessel occlusion. This was discussed by telephone with Dr. Roland Rack by Dr. Jola Baptist on 04/08/2018 at 1135 hours 2. Mild for age atherosclerosis with no significant arterial stenosis in the head or neck. 3. Incidental finding of heterogeneous bone mineralization in the thoracic spine which may be degenerative but is incompletely visualized. If there is thoracic back pain recommend further imaging with either Thoracic Spine MRI or CT (noncontrast may suffice). This was also communicated to Dr. Leonel Ramsay at 11:51 am on 04/08/2018 by text page via the [facility] messaging system. Electronically Signed   By: Genevie Ann M.D.   On: 04/08/2018 11:51   Mr Brain Wo Contrast  Result Date: 04/08/2018 CLINICAL DATA:  Initial evaluation for acute difficulty reading. EXAM: MRI HEAD WITHOUT CONTRAST TECHNIQUE: Multiplanar, multiecho pulse sequences of the brain and surrounding structures were obtained without intravenous contrast. COMPARISON:  Prior CT and CTA from the same day. FINDINGS: Brain: Cerebral volume within normal limits for age. Minimal scattered T2/FLAIR hyperintensities within the  supratentorial cerebral white matter, nonspecific, but felt to be within normal limits for age. No abnormal foci of restricted diffusion to suggest acute or subacute ischemia. Gray-white matter differentiation maintained. No encephalomalacia to suggest chronic infarction. No foci of susceptibility artifact to suggest acute or chronic intracranial hemorrhage. No mass lesion, midline shift or mass effect. No hydrocephalus. No extra-axial fluid collection. Pituitary gland within normal. Vascular: Major intracranial vascular flow voids maintained. Skull and upper cervical spine: Craniocervical junction within normal limits. Mild degenerative spondylolysis noted within the upper cervical spine without significant stenosis. Bone marrow signal intensity normal. No scalp soft tissue abnormality. Sinuses/Orbits: Lobes in orbital soft tissues within normal limits. Mild-to-moderate mucosal thickening throughout the paranasal sinuses. No mastoid effusion. Other: None. IMPRESSION: Normal brain MRI for age. No acute intracranial abnormality identified. Electronically Signed   By: Jeannine Boga M.D.   On: 04/08/2018 18:06   Ct Head Code Stroke Wo Contrast  Result Date: 04/08/2018 CLINICAL DATA:  Code stroke.  RIGHT facial droop. TIA, initial exam. EXAM: CT HEAD WITHOUT CONTRAST TECHNIQUE: Contiguous axial images were obtained from the base of the skull through the vertex without intravenous contrast. COMPARISON:  None. FINDINGS: Brain: No evidence of acute infarction, hemorrhage, hydrocephalus, extra-axial collection or mass lesion/mass effect. Mild atrophy. Hypoattenuation of white matter, likely small vessel disease. Suspected chronic RIGHT cerebellar infarct.  Vascular: Calcification of the cavernous internal carotid arteries consistent with cerebrovascular atherosclerotic disease. No signs of intracranial large vessel occlusion. Skull: Calvarium intact. Sinuses/Orbits: No acute finding. Other: None. ASPECTS [facility]  Stroke Program Early CT Score) - Ganglionic level infarction (caudate, lentiform nuclei, internal capsule, insula, M1-M3 cortex): 7 - Supraganglionic infarction (M4-M6 cortex): 3 Total score (0-10 with 10 being normal): 10 IMPRESSION: 1. Atrophy and small vessel disease. No acute intracranial findings. 2. ASPECTS is 10. * These results were communicated to Dr. Leonel Ramsay at 11:30 amon 04/08/2018 Electronically Signed   By: Staci Righter M.D.   On: 04/08/2018 11:41    PHYSICAL EXAM Pleasant elderly Caucasian male not in distress.  . Afebrile. Head is nontraumatic. Neck is supple without bruit.    Cardiac exam no murmur or gallop. Lungs are clear to auscultation. Distal pulses are well felt. Neurological Exam ;  Awake  Alert oriented x 3. Normal speech and language.eye movements full without nystagmus.fundi were not visualized. Vision acuity and fields appear normal. Hearing is normal. Palatal movements are normal. Face symmetric. Tongue midline. Normal strength, tone, reflexes and coordination. Normal sensation. Gait deferred. ABCD2 score 5  ASSESSMENT/PLAN [name] is a 73 y.o. male with history of HLD, CRAO and hx transient difficulty speaking presenting with transient difficulty reading and word finding difficulties.   L brain TIA, concern for embolic source given 2 prior embolic events (TIA and CRAO)  ABCD2 score 5  Code Stroke CT head No acute stroke. Small vessel disease. Atrophy. ASPECTS 10.     CTA head & neck no LVO. Mild atherosclerosis. Heterogeneous bone mineralization in thoracic spine  MRI  normal  2D Echo w/ bubble pending   Unable to perform elective TEE d/t corona virus or implant loop recorder as an IP  LDL 86  HgbA1c 5.3  Lovenox 40 mg sq daily for VTE prophylaxis  No antithrombotic prior to admission (stopped aspirin several years ago), now on aspirin 81 mg daily and clopidogrel 75 mg daily following plavix load. Given TIA with ABCD2 score >4  mild  stroke, recommend aspirin 81 mg and plavix 75 mg daily x 3 weeks, then aspirin alone. Orders adjusted.   Therapy recommendations:  No therapy needs  Disposition:  Return home Referred to DR. Adrian Prows for loop recorder placement as an outpatient [place] for d/c from stroke standpoint once 2D resulted, if within normal limits Follow-up Stroke Clinic at [facility] in 4 weeks. Office will call with appointment date and time. Order placed.   Blood Pressure  Elevated on admission > 150  Improved during the night . Not on home meds . BP goal normotensive . monitor  Hyperlipidemia  Home meds:  lipitor 40  3x week, resumed in hospital  LDL 86, goal < 70  Will increase statin to 40 mg daily as LDL not at goal   Continue statin at discharge  Other Stroke Risk Factors  Advanced age  Former Cigarette smoker, quit 6 yrs ago  ETOH use, advised to drink no more than 2 drink(s) a day  Hx TIA  6 yrs ago speech difficulty without workup  3 yrs ago CRAO without embolic workup  Other Active Problems  Hx tubular adenoma colon  Hospital day # 0  Burnetta Sabin, MSN, APRN, ANVP-BC, AGPCNP-BC Advanced Practice Stroke Nurse [place] for Schedule & Pager information 04/09/2018 9:48 AM  I have personally obtained history,examined this patient, reviewed notes, independently viewed imaging studies, participated  in medical decision making and plan of care.ROS completed by me personally and pertinent positives fully documented  I have made any additions or clarifications directly to the above note. Agree with note above. He presented with transient vision difficulties with difficulty reading which appears to have resolved. Likely left hemispheric TIA. He has prior history of TIA as well. MRI is negative for stroke. Suspicion for paroxysmal A. Fib. He may benefit with outpatient prolonged cardiac monitoring. Recommend dual antiplatelet therapy for 3 weeks  followed by aspirin and aggressive risk factor modification. Long discussion with the patient and his wife and answered questions. Discussed with Dr. Tamala Julian. Greater than 50% time during this 35 minute visit was spent on counseling and coordination of care about his TIA and answering questions. Antony Contras, MD Medical Director [facility] Stroke Center Pager: [phone] 04/09/2018 2:24 PM   To contact Stroke Continuity provider, please refer to [email or web address] After hours, contact General Neurology

## 2018-04-16 ENCOUNTER — Emergency Department (HOSPITAL_COMMUNITY): Payer: Medicare Other

## 2018-04-16 ENCOUNTER — Encounter (HOSPITAL_COMMUNITY): Payer: Self-pay

## 2018-04-16 ENCOUNTER — Emergency Department (HOSPITAL_COMMUNITY)
Admission: EM | Admit: 2018-04-16 | Discharge: 2018-04-16 | Disposition: A | Discharge status: Home / Self Care | Payer: Medicare Other | Attending: Emergency Medicine | Admitting: Emergency Medicine

## 2018-04-16 ENCOUNTER — Other Ambulatory Visit: Payer: Self-pay

## 2018-04-16 DIAGNOSIS — Z87891 Personal history of nicotine dependence: Secondary | ICD-10-CM | POA: Insufficient documentation

## 2018-04-16 DIAGNOSIS — Z79899 Other long term (current) drug therapy: Secondary | ICD-10-CM | POA: Diagnosis not present

## 2018-04-16 DIAGNOSIS — Z7982 Long term (current) use of aspirin: Secondary | ICD-10-CM | POA: Diagnosis not present

## 2018-04-16 DIAGNOSIS — H538 Other visual disturbances: Secondary | ICD-10-CM | POA: Diagnosis not present

## 2018-04-16 NOTE — ED Provider Notes (Signed)
[facility] EMERGENCY DEPARTMENT Provider Note   CSN: [id number] Arrival date & time: 04/16/18  1641    History   Chief Complaint Chief Complaint  Patient presents with  . Stroke Symptoms    HPI Joseph Pitts is a 73 y.o. male.  He was recently admitted for a TIA stroke work-up about a week ago.  At that point his symptoms were some blurry vision that turned into difficulty reading words.  He was admitted and had an echo CT and MRI.  He was placed on Plavix and a full dose aspirin.  He has had no symptoms until today when he experienced 5 minutes of some blurry vision.  It was not associated with any other neurologic symptoms.  He feels back to baseline.  No recent illness no cough no shortness of breath no numbness no weakness.     The history is provided by the patient.  Cerebrovascular Accident  This is a recurrent problem. The current episode started 1 to 2 hours ago. The problem has been resolved. Pertinent negatives include no chest pain, no abdominal pain, no headaches and no shortness of breath. Nothing aggravates the symptoms. The symptoms are relieved by rest. He has tried nothing for the symptoms. The treatment provided no relief.    Past Medical History:  Diagnosis Date  . Cancer ([place])    basal cell on nose removed  . Diverticulosis   . Hyperlipidemia   . Tubular adenoma of colon 2016  . Vascular occlusion    retinal vein occlusion    Patient Active Problem List   Diagnosis Date Noted  . Essential hypertension 04/09/2018  . History of tobacco use 04/09/2018  . TIA (transient ischemic attack) 04/08/2018  . 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 Strain of acromioclavicular joint 06/29/2015  . Left shoulder pain 10/29/2013  . Inguinal hernia 02/03/2013  . First degree heart block 02/03/2013  . Retinal hemorrhage of left eye 02/03/2013  . Dyslipidemia 07/22/2012  . Erectile dysfunction 07/22/2012    Past Surgical History:   Procedure Laterality Date  . COLONOSCOPY    . TONSILLECTOMY          Home Medications    Prior to Admission medications   Medication Sig Start Date End Date Taking? Authorizing Provider  aspirin 81 MG tablet Take 81 mg by mouth daily.    [provider]  atorvastatin (LIPITOR) 40 MG tablet Take 1 tablet (40 mg total) by mouth daily. 04/09/18   Norval Morton, MD  Cholecalciferol (VITAMIN D3) 25 MCG (1000 UT) CAPS Take 1,000 Units by mouth daily.    [provider]  clopidogrel (PLAVIX) 75 MG tablet Take 1 tablet (75 mg total) by mouth daily. 04/10/18   Norval Morton, MD  Multiple Vitamin (MULTIVITAMIN) tablet Take 1 tablet by mouth daily.    [provider]  OVER THE COUNTER MEDICATION Take 1 tablet by mouth daily. Prostate plus health complex.    [provider]    Family History Family History  Problem Relation Age of Onset  . Heart disease Mother   . Prostate cancer Father   . Colon cancer Neg Hx   . Esophageal cancer Neg Hx   . Rectal cancer Neg Hx   . Stomach cancer Neg Hx     Social History Social History   Tobacco Use  . Smoking status: Former Smoker    Types: Cigarettes    Last attempt to quit: 1967  Years since quitting: 53.2  . Smokeless tobacco: Never Used  Substance Use Topics  . Alcohol use: Yes    Alcohol/week: 7.0 standard drinks    Types: 7 Cans of beer per week  . Drug use: No     Allergies   Sulfa antibiotics   Review of Systems Review of Systems  Constitutional: Negative for fever.  HENT: Negative for sore throat.   Eyes: Positive for visual disturbance.  Respiratory: Negative for shortness of breath.   Cardiovascular: Negative for chest pain.  Gastrointestinal: Negative for abdominal pain.  Genitourinary: Negative for dysuria.  Musculoskeletal: Negative for neck pain.  Skin: Negative for rash.  Neurological: Negative for headaches.     Physical Exam Updated Vital Signs BP (!) 162/84 (BP  Location: Right Arm) Comment: Simultaneous filing. User may not have seen previous data.  Pulse (!) 56 Comment: Simultaneous filing. User may not have seen previous data.  Resp 17 Comment: Simultaneous filing. User may not have seen previous data.  Ht 5\' 8"  (1.727 m)   Wt 64.3 kg   SpO2 99% Comment: Simultaneous filing. User may not have seen previous data.  BMI 21.55 kg/m   Physical Exam Vitals signs and nursing note reviewed.  Constitutional:      Appearance: He is well-developed.  HENT:     Head: Normocephalic and atraumatic.  Eyes:     Conjunctiva/sclera: Conjunctivae normal.     Pupils: Pupils are equal, round, and reactive to light.  Neck:     Musculoskeletal: Neck supple.  Cardiovascular:     Rate and Rhythm: Normal rate and regular rhythm.     Heart sounds: No murmur.  Pulmonary:     Effort: Pulmonary effort is normal. No respiratory distress.     Breath sounds: Normal breath sounds.  Abdominal:     Palpations: Abdomen is soft.     Tenderness: There is no abdominal tenderness.  Musculoskeletal: Normal range of motion.     Right lower leg: No edema.     Left lower leg: No edema.  Skin:    General: Skin is warm and dry.     Capillary Refill: Capillary refill takes less than 2 seconds.  Neurological:     General: No focal deficit present.     Mental Status: He is alert and oriented to person, place, and time.     GCS: GCS eye subscore is 4. GCS verbal subscore is 5. GCS motor subscore is 6.     Cranial Nerves: No cranial nerve deficit.     Sensory: No sensory deficit.     Motor: No weakness.     Coordination: Coordination normal.     Gait: Gait normal.      ED Treatments / Results  Labs (all labs ordered are listed, but only abnormal results are displayed) Labs Reviewed - No data to display  EKG EKG Interpretation  Date/Time:  Wednesday April 16 2018 16:55:52 EDT Ventricular Rate:  72 PR Interval:    QRS Duration: 87 QT Interval:  372 QTC Calculation:  408 R Axis:   -42 Text Interpretation:  Sinus rhythm Left atrial enlargement Left axis deviation Anterior infarct, old similar to prior 3.20 Confirmed by Aletta Edouard [phone]) on 04/16/2018 5:29:35 PM   Radiology Ct Head Wo Contrast  Result Date: 04/16/2018 CLINICAL DATA:  Blurry vision.  TIA last week. EXAM: CT HEAD WITHOUT CONTRAST TECHNIQUE: Contiguous axial images were obtained from the base of the skull through the vertex without intravenous contrast. COMPARISON:  MR brain and CT head dated April 08, 2018. FINDINGS: Brain: No evidence of acute infarction, hemorrhage, hydrocephalus, extra-axial collection or mass lesion/mass effect. Vascular: Atherosclerotic vascular calcification of the carotid siphons. No hyperdense vessel. Skull: Normal. Negative for fracture or focal lesion. Sinuses/Orbits: No acute finding. Other: None. IMPRESSION: 1.  No acute intracranial abnormality. Electronically Signed   By: Titus Dubin M.D.   On: 04/16/2018 18:46    Procedures Procedures (including critical care time)  Medications Ordered in ED Medications - No data to display   Initial Impression / Assessment and Plan / ED Course  I have reviewed the triage vital signs and the nursing notes.  Pertinent labs & imaging results that were available during my care of the patient were reviewed by me and considered in my medical decision making (see chart for details).  Clinical Course as of Apr 17 1622  Wed Apr 16, 2018  1736 Patient here with 5 minutes of visual symptoms that have since resolved.  He had a full stroke work-up a week ago for similar but more involved symptoms.  He is currently on Plavix and aspirin.  Differential includes TIA, stroke, complex migraine, embolic phenomenon.  He is symptom-free now.  I reviewed his presentation with Dr. Malen Gauze from neurology who recommends a CT head but if this is negative for bleed he would discharge him and have him follow-up as outpatient.   [MB]  1902 CT  Head Wo Contrast [MB]  1903 Head CT no acute.  Will discharge and have him follow-up per outpatient plan.   [MB]    Clinical Course User Index [MB] Hayden Rasmussen, MD        Final Clinical Impressions(s) / ED Diagnoses   Final diagnoses:  Blurry vision    ED Discharge Orders    None       Hayden Rasmussen, MD 04/17/18 [id number]

## 2018-04-16 NOTE — ED Notes (Signed)
Patient verbalizes understanding of discharge instructions. Opportunity for questioning and answers were provided. Armband removed by staff, pt discharged from ED.  

## 2018-04-16 NOTE — ED Triage Notes (Signed)
Arrives at ED w blurred vision, TIA last week. LSW 1630, TIA last week

## 2018-04-16 NOTE — ED Notes (Signed)
Patient transported to CT 

## 2018-04-16 NOTE — Discharge Instructions (Addendum)
You were seen for evaluation of blurry vision that lasted 5 minutes.  Recently you had an extensive stroke evaluation for similar more involved symptoms.  We reviewed your symptoms with neurology and they recommended that you have a CAT scan.  Your CAT scan did not show any acute findings.  Please continue your medications as prescribed and follow-up as per your discharge instructions.  Return if any concerns.

## 2018-04-16 NOTE — ED Notes (Signed)
On arrival pt reports "blurred vision for 5 minutes".  States he was discharged 1 week ago for TIA.  Dr. Maryan Rued and Dr. Rory Percy notified of pt in hallway on way to treatment room. No other neuro deficits noted on arrival.

## 2018-04-17 DIAGNOSIS — G459 Transient cerebral ischemic attack, unspecified: Secondary | ICD-10-CM | POA: Diagnosis not present

## 2018-06-18 ENCOUNTER — Ambulatory Visit (INDEPENDENT_AMBULATORY_CARE_PROVIDER_SITE_OTHER): Payer: Medicare Other | Admitting: Cardiology

## 2018-06-18 ENCOUNTER — Other Ambulatory Visit: Payer: Self-pay

## 2018-06-18 ENCOUNTER — Encounter: Payer: Self-pay | Admitting: Cardiology

## 2018-06-18 VITALS — BP 154/88 | HR 78 | Temp 97.7°F | Ht 68.0 in | Wt 153.5 lb

## 2018-06-18 DIAGNOSIS — G459 Transient cerebral ischemic attack, unspecified: Secondary | ICD-10-CM | POA: Diagnosis not present

## 2018-06-18 DIAGNOSIS — Z8673 Personal history of transient ischemic attack (TIA), and cerebral infarction without residual deficits: Secondary | ICD-10-CM | POA: Diagnosis not present

## 2018-06-18 DIAGNOSIS — R030 Elevated blood-pressure reading, without diagnosis of hypertension: Secondary | ICD-10-CM

## 2018-06-18 DIAGNOSIS — E7800 Pure hypercholesterolemia, unspecified: Secondary | ICD-10-CM | POA: Diagnosis not present

## 2018-06-18 LAB — ECHOCARDIOGRAM COMPLETE
Height: 68 in
Weight: 2268.09 oz

## 2018-06-18 NOTE — Progress Notes (Signed)
Primary Physician/Referring:  Lajean Manes, MD/Pramod Leonie Man, MD  Patient ID: Joseph Pitts, male    DOB: 08/02/1945, 73 y.o.   MRN: [id number]  Chief Complaint  Patient presents with  . Transient Ischemic Attack    eval for loop recorder    HPI: Joseph Pitts  is a 73 y.o. male  with Hyperlipidemia, remote tobacco quit > 50 years ago,  history of TIA in 2013, history of left retinal vein occlusion in 2014, again presented to the emergency room on 04/08/2018 with a visual disturbance,, expressive aphasia  otherwise no other symptoms and episode resolved after 2 hours. He was evaluated in the emergency room, "stroke was called, he was admitted for observation for TIA.  Except for PVCs on telemetry and echocardiogram revealing no wall motion abnormality, patient was discharged home with recommendation for outpatient evaluation for group coronary implantation in view of recurrent episodes of TIA-like symptoms and risk of atrial fibrillation. He was evaluated by neurology, Dr. Leonie Man and felt that his symptoms are consistent with left brain TIA, concern for embolic source given to prior embolic events including TIA and was recommended outpatient coronary implantation due to covid 19, and discharged home. He had presented one week later with blurred vision and was evaluated in the ED and thought to be due to sinusitis. He now presents for follow-up.   His wife is a retired Nurse, adult by profession. He remains active, no chest pain, no dyspnea, no palpitations. He exercises for 40 minutes on elliptical and also walks daily, does do weights as well.   Past Medical History:  Diagnosis Date  . Cancer ([place])    basal cell on nose removed  . Diverticulosis   . Hyperlipidemia   . Tubular adenoma of colon 2016  . Vascular occlusion    retinal vein occlusion    Past Surgical History:  Procedure Laterality Date  . COLONOSCOPY    . TONSILLECTOMY      Social History   Socioeconomic  History  . Marital status: Married    Spouse name: Not on file  . Number of children: Not on file  . Years of education: Not on file  . Highest education level: Not on file  Occupational History  . Occupation: retired  Scientific laboratory technician  . Financial resource strain: Not on file  . Food insecurity:    Worry: Not on file    Inability: Not on file  . Transportation needs:    Medical: Not on file    Non-medical: Not on file  Tobacco Use  . Smoking status: Former Smoker    Types: Cigarettes    Last attempt to quit: 1967    Years since quitting: 53.4  . Smokeless tobacco: Never Used  Substance and Sexual Activity  . Alcohol use: Yes    Alcohol/week: 7.0 standard drinks    Types: 7 Cans of beer per week  . Drug use: No  . Sexual activity: Not on file  Lifestyle  . Physical activity:    Days per week: Not on file    Minutes per session: Not on file  . Stress: Not on file  Relationships  . Social connections:    Talks on phone: Not on file    Gets together: Not on file    Attends religious service: Not on file    Active member of club or organization: Not on file    Attends meetings of clubs or organizations: Not on file    Relationship  status: Not on file  . Intimate partner violence:    Fear of current or ex partner: Not on file    Emotionally abused: Not on file    Physically abused: Not on file    Forced sexual activity: Not on file  Other Topics Concern  . Not on file  Social History Narrative  . Not on file   Review of Systems  Constitution: Negative for chills, decreased appetite, malaise/fatigue and weight gain.  Eyes: Positive for blurred vision (left eye chronic).  Cardiovascular: Negative for dyspnea on exertion, leg swelling and syncope.  Endocrine: Negative for cold intolerance.  Hematologic/Lymphatic: Does not bruise/bleed easily.  Musculoskeletal: Negative for joint swelling.  Gastrointestinal: Negative for abdominal pain, anorexia, change in bowel habit,  hematochezia and melena.  Neurological: Negative for headaches and light-headedness.  Psychiatric/Behavioral: Negative for depression and substance abuse.  All other systems reviewed and are negative.     Objective  Blood pressure (!) 154/88, pulse 78, temperature 97.7 F (36.5 C), height 5\' 8"  (1.727 m), weight 153 lb 8 oz (69.6 kg), SpO2 99 %. Body mass index is 23.34 kg/m.    Physical Exam  Constitutional: He appears well-developed and well-nourished. No distress.  HENT:  Head: Atraumatic.  Eyes: Conjunctivae are normal.  Neck: Neck supple. No JVD present. No thyromegaly present.  Cardiovascular: Normal rate, regular rhythm, normal heart sounds and intact distal pulses. Exam reveals no gallop.  No murmur heard. Pulmonary/Chest: Effort normal and breath sounds normal.  Abdominal: Soft. Bowel sounds are normal.  Musculoskeletal: Normal range of motion.  Neurological: He is alert.  Skin: Skin is warm and dry.  Psychiatric: He has a normal mood and affect.   Radiology: No results found.  Laboratory examination:    CMP Latest Ref Rng & Units 04/08/2018 04/08/2018 02/09/2014  Glucose 70 - 99 mg/dL - 109(H) 91  BUN 8 - 23 mg/dL - 24(H) 21  Creatinine 0.61 - 1.24 mg/dL 1.00 1.10 1.04  Sodium 135 - 145 mmol/L - 137 138  Potassium 3.5 - 5.1 mmol/L - 4.3 4.5  Chloride 98 - 111 mmol/L - 103 102  CO2 22 - 32 mmol/L - 25 27  Calcium 8.9 - 10.3 mg/dL - 9.4 9.2  Total Protein 6.5 - 8.1 g/dL - 6.4(L) 6.6  Total Bilirubin 0.3 - 1.2 mg/dL - 0.9 0.6  Alkaline Phos 38 - 126 U/L - 103 94  AST 15 - 41 U/L - 34 26  ALT 0 - 44 U/L - 28 16   CBC Latest Ref Rng & Units 04/08/2018 02/09/2014 02/03/2013  WBC 4.0 - 10.5 K/uL 6.0 5.6 7.3  Hemoglobin 13.0 - 17.0 g/dL 15.0 13.8 16.2  Hematocrit 39.0 - 52.0 % 44.9 40.2 46.7  Platelets 150 - 400 K/uL 246 239 285   Lipid Panel     Component Value Date/Time   CHOL 152 04/09/2018 0248   TRIG 38 04/09/2018 0248   HDL 58 04/09/2018 0248   CHOLHDL 2.6  04/09/2018 0248   VLDL 8 04/09/2018 0248   LDLCALC 86 04/09/2018 0248   HEMOGLOBIN A1C Lab Results  Component Value Date   HGBA1C 5.3 04/09/2018   MPG 105.41 04/09/2018   TSH Recent Labs    04/08/18 1841  TSH 1.705    PRN Meds:. Medications Discontinued During This Encounter  Medication Reason  . clopidogrel (PLAVIX) 75 MG tablet Completed Course   Current Meds  Medication Sig  . aspirin 81 MG tablet Take 81 mg by mouth daily.  Marland Kitchen  atorvastatin (LIPITOR) 40 MG tablet Take 1 tablet (40 mg total) by mouth daily.  . Cholecalciferol (VITAMIN D3) 25 MCG (1000 UT) CAPS Take 1,000 Units by mouth daily.  . Multiple Vitamin (MULTIVITAMIN) tablet Take 1 tablet by mouth daily.  Marland Kitchen OVER THE COUNTER MEDICATION Take 1 tablet by mouth daily. Prostate plus health complex.    Cardiac Studies:   Echocardiogram 04/09/2018: 1. The left ventricle has normal systolic function with an ejection fraction of 60-65%. The cavity size was normal. Left ventricular diastolic function could not be evaluated.  2. The right ventricle has normal systolic function. The cavity was normal. There is no increase in right ventricular wall thickness.  3. Left atrial size was mildly dilated.  Assessment   TIA (transient ischemic attack)  History of recurrent TIAs - Plan: EKG 12-Lead  Hypercholesteremia  Elevated BP without diagnosis of hypertension  EKG 06/18/2018: Normal sinus rhythm at rate of 62 bpm, borderline criteria for left atrial enlargement, leftward axis, otherwise normal EKG.  Recommendations:   Patient referred to me by Dr. Leonie Man for evaluation for possible loop recorder implantation after recurrent TIA, initial episode in 2013 and again in 2020 presenting with visual disturbances and expressive aphasia. In 2014 he has also had left central retinal vein occlusion that is incidental.  Lipids are being managed by his PCP, he was taking atorvastatin every other day but now has been taking it daily.   Now on aspirin indefinitely.  Has mild atherosclerotic changes by CTA, but no carotid stenosis and MRI of the brain was normal.  He does have elevated blood pressure today but there is no diagnosis of hypertension.  He is not on any antihypertensive medications.  Advised him to monitor this at home and to bring me the recordings.  His wife is a Software engineer who is retired.  We discussed the options of management of his recurrent TIA. Whether to proceed with loop recorder implant Versus a TEE, however patient is presently 73 years of age and PFO is probably unlikely.  I also again had curbside discussions with Dr. Leonie Man who agrees.  Hence I'll set him up for loop recorder implantation on Friday.  No change in the therapy for now otherwise, would like to see him back in 2-3 weeks for follow-up.  All questions answered and this was a 60 minute OV consult with direct face to face encounter > 50% of the time.   Adrian Prows, MD, [facility] 06/18/2018, 8:59 PM [facility]. [place] Pager: [phone] Office: [phone] If no answer Cell [phone]

## 2018-06-19 ENCOUNTER — Telehealth: Payer: Self-pay

## 2018-06-19 NOTE — Progress Notes (Signed)
   Primary Physician:  Lajean Manes, MD   Patient ID: Ok [name], male    DOB: 03-Mar-1945, 73 y.o.   MRN: [id number]    Prodedure performed: Insertion of Biotronik Loop Recorder. Serial # [id number]  Indication: TIA  Procedure performed by Dr. Einar Gip with myself assisting. After obtaining informed consent, explaining the procedure to the patient, under sterile precautions, local anesthesia with 1% lidocaine with epinephrine was injected subcutaneously in the left parasternal region.  20 mL utilized.  A small nick was made in the left paraspinal region at the 3rd intercostal space.  Biotronik loop recorder was inserted without any complications.  Patient tolerated the procedure well.  The incision was closed with Steri-Strips.  There is no blood loss, no hematoma.  Settings:     Atrial fibrillation: Medium    High Ventricular rate (bpm): 160    Bradycardia (bpm): 40    Sudden rate drop (%): 50    Asystole: 3  There were no vitals taken for this visit. There is no height or weight on file to calculate BMI.  Written instrtuctions regarding wound care given to the patient. Patient will follow up in approximately 7 days for device and wound check.  Miquel Dunn, MSN, APRN, FNP-C [facility]. [place] Office: [phone] Fax: [phone]

## 2018-06-19 NOTE — Telephone Encounter (Signed)
I called pt that his appt on 06/25/2018 will be change to video visit due to [place] 19. Pt gave verbal consent to do video and file insurance. I confirmed pts cell number and email address. Pt stated he has computer with camera on it. I explain the doxy process. 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 I updated meds, pharmacy, and PCP.I stated a email will be sent day prior to visit.

## 2018-06-20 ENCOUNTER — Ambulatory Visit (INDEPENDENT_AMBULATORY_CARE_PROVIDER_SITE_OTHER): Payer: Medicare Other | Admitting: Cardiology

## 2018-06-20 ENCOUNTER — Other Ambulatory Visit: Payer: Self-pay

## 2018-06-20 DIAGNOSIS — Z8673 Personal history of transient ischemic attack (TIA), and cerebral infarction without residual deficits: Secondary | ICD-10-CM

## 2018-06-20 DIAGNOSIS — I669 Occlusion and stenosis of unspecified cerebral artery: Secondary | ICD-10-CM | POA: Diagnosis not present

## 2018-06-20 DIAGNOSIS — Z9889 Other specified postprocedural states: Secondary | ICD-10-CM

## 2018-06-20 DIAGNOSIS — G459 Transient cerebral ischemic attack, unspecified: Secondary | ICD-10-CM

## 2018-06-20 HISTORY — DX: Other specified postprocedural states: Z98.890

## 2018-06-20 HISTORY — PX: LOOP RECORDER IMPLANT: SHX5954

## 2018-06-23 ENCOUNTER — Encounter: Payer: Self-pay | Admitting: Cardiology

## 2018-06-25 ENCOUNTER — Encounter: Payer: Self-pay | Admitting: Adult Health

## 2018-06-25 ENCOUNTER — Other Ambulatory Visit: Payer: Self-pay

## 2018-06-25 ENCOUNTER — Ambulatory Visit (INDEPENDENT_AMBULATORY_CARE_PROVIDER_SITE_OTHER): Payer: Medicare Other | Admitting: Adult Health

## 2018-06-25 DIAGNOSIS — I10 Essential (primary) hypertension: Secondary | ICD-10-CM

## 2018-06-25 DIAGNOSIS — E785 Hyperlipidemia, unspecified: Secondary | ICD-10-CM

## 2018-06-25 DIAGNOSIS — G459 Transient cerebral ischemic attack, unspecified: Secondary | ICD-10-CM | POA: Diagnosis not present

## 2018-06-25 DIAGNOSIS — Z8673 Personal history of transient ischemic attack (TIA), and cerebral infarction without residual deficits: Secondary | ICD-10-CM

## 2018-06-25 NOTE — Progress Notes (Signed)
[facility] [address] [place]. [place] [id number] [phone]       VIRTUAL VISIT FOLLOW UP NOTE  [name] Date of Birth:  Apr 21, 1945 Medical Record Number:  [id number]   Reason for Referral:  hospital stroke follow up    Virtual Visit via Video Note  I connected with Joseph Pitts on 06/25/18 at 10:15 AM EDT by a video enabled telemedicine application located remotely in my own home and verified that I am speaking with the correct person using two identifiers who was located at their own home and accompianed by his wife.   Visit scheduled by Lemont Fillers, RN. She discussed the limitations of evaluation and management by telemedicine and the availability of in person appointments. The patient expressed understanding and agreed to proceed.Please see telephone note for additional scheduling information and consent.    HPI: Joseph Pitts was initially scheduled today for in office hospital follow-up regarding TIA but due to COVID-19 safety precautions, visit transition to telemedicine via [email or web address] with patients consent. History obtained from patient and chart review. Reviewed all radiology images and labs personally.   Joseph Pitts is a 73 y.o. male with history of HLD, CRAO and hx transient difficulty speaking presenting with transient difficulty reading and word finding difficulties.   Stroke work-up did not show acute findings therefore diagnosed with left brain TIA concerning for embolic source given to prior embolic events (TIA and CRAO).  2D echo unremarkable.  Unable to perform elective TEE or place loop recorder due to pandemic and recommended follow-up as outpatient.  LDL 86 and A1c 5.3.  Recommended DAPT for 3 weeks and aspirin alone.  HTN stable.  Other stroke risk factors include advanced age, former tobacco use, EtOH use and prior history of TIA.  Discharged home in stable condition without therapy needs. He returned to ED on  04/16/2018 after experiencing 5 minutes of blurred vision but no other neurological symptoms.  CT head obtained and was negative for acute infarct. They questioned whether sinus issues vs TIA.   He has been stable from a stroke standpoint without recurring or new stroke/TIA symptoms.  He did have loop recorder placed by Dr. Einar Gip on 06/20/2018 for long-term monitoring of potential atrial fibrillation.  Completed 3 weeks DAPT and continues on aspirin alone without side effects of bleeding or bruising.  Continues on atorvastatin without side effects myalgias. He was previously experiencing muscle pains and started on CoQ10 with benefit. Blood pressure monitored at home and typically 130s/70s. He has returned back to all prior activities without complications. He does have hx of migraine with visual aura. No recent migraines.  No further concerns at this time.  Denies new or worsening stroke/TIA symptoms.         ROS:   14 system review of systems performed and negative with exception of no complaints  PMH:  Past Medical History:  Diagnosis Date  . Cancer ([place])    basal cell on nose removed  . Diverticulosis   . Hyperlipidemia   . Tubular adenoma of colon 2016  . Vascular occlusion    retinal vein occlusion    PSH:  Past Surgical History:  Procedure Laterality Date  . COLONOSCOPY    . TONSILLECTOMY      Social History:  Social History   Socioeconomic History  . Marital status: Married    Spouse name: Not on file  . Number of children: Not on file  . Years of education:  Not on file  . Highest education level: Not on file  Occupational History  . Occupation: retired  Scientific laboratory technician  . Financial resource strain: Not on file  . Food insecurity:    Worry: Not on file    Inability: Not on file  . Transportation needs:    Medical: Not on file    Non-medical: Not on file  Tobacco Use  . Smoking status: Former Smoker    Types: Cigarettes    Last attempt to quit: 1967    Years  since quitting: 53.4  . Smokeless tobacco: Never Used  Substance and Sexual Activity  . Alcohol use: Yes    Alcohol/week: 7.0 standard drinks    Types: 7 Cans of beer per week  . Drug use: No  . Sexual activity: Not on file  Lifestyle  . Physical activity:    Days per week: Not on file    Minutes per session: Not on file  . Stress: Not on file  Relationships  . Social connections:    Talks on phone: Not on file    Gets together: Not on file    Attends religious service: Not on file    Active member of club or organization: Not on file    Attends meetings of clubs or organizations: Not on file    Relationship status: Not on file  . Intimate partner violence:    Fear of current or ex partner: Not on file    Emotionally abused: Not on file    Physically abused: Not on file    Forced sexual activity: Not on file  Other Topics Concern  . Not on file  Social History Narrative  . Not on file    Family History:  Family History  Problem Relation Age of Onset  . Heart disease Mother   . Prostate cancer Father   . Arthritis Sister   . Colon cancer Neg Hx   . Esophageal cancer Neg Hx   . Rectal cancer Neg Hx   . Stomach cancer Neg Hx     Medications:   Current Outpatient Medications on File Prior to Visit  Medication Sig Dispense Refill  . aspirin 81 MG tablet Take 81 mg by mouth daily.    Marland Kitchen atorvastatin (LIPITOR) 40 MG tablet Take 1 tablet (40 mg total) by mouth daily. 30 tablet 0  . Cholecalciferol (VITAMIN D3) 25 MCG (1000 UT) CAPS Take 1,000 Units by mouth daily.    . Multiple Vitamin (MULTIVITAMIN) tablet Take 1 tablet by mouth daily.    Marland Kitchen OVER THE COUNTER MEDICATION Take 1 tablet by mouth daily. Prostate plus health complex.     No current facility-administered medications on file prior to visit.     Allergies:   Allergies  Allergen Reactions  . Sulfa Antibiotics     Unknown.  "Mother told me not to take"     Physical Exam  Depression screen [facility] 2/9  06/25/2018  Decreased Interest 0  Down, Depressed, Hopeless 0  PHQ - 2 Score 0     General: well developed, well nourished, pleasant elderly Caucasian male, seated, in no evident distress Head: head normocephalic and atraumatic.     Neurologic Exam Mental Status: Awake and fully alert. Oriented to place and time. Recent and remote memory intact. Attention span, concentration and fund of knowledge appropriate. Mood and affect appropriate.  Cranial Nerves: Extraocular movements full without nystagmus. Hearing intact to voice. Facial sensation intact. Face, tongue, palate moves normally and symmetrically.  Motor: No evidence of weakness per drift assessment Sensory.: intact to light touch Coordination: Rapid alternating movements normal in all extremities. Finger-to-nose and heel-to-shin performed accurately bilaterally. Gait and Station: Arises from chair without difficulty. Stance is normal. Gait demonstrates normal stride length and balance. Able to heel, toe and tandem walk without difficulty.  Reflexes: UTA   NIHSS  0 Modified Rankin  0    ASSESSMENT: Joseph Pitts is a 72 y.o. year old male here with left brain TIA on 04/08/18 secondary to likely embolic therefore loop recorder placed outpatient on 06/20/18. Vascular risk factors include HTN and HLD.  He has been stable from a stroke standpoint without recurring or new stroke/TIA symptoms.    PLAN:  1. TIA : Continue aspirin 81 mg daily  and atorvastatin 40mg  daily  for secondary stroke prevention. Maintain strict control of hypertension with blood pressure goal below 130/90, diabetes with hemoglobin A1c goal below 6.5% and cholesterol with LDL cholesterol (bad cholesterol) goal below 70 mg/dL.  I also advised the patient to eat a healthy diet with plenty of whole grains, cereals, fruits and vegetables, exercise regularly with at least 30 minutes of continuous activity daily and maintain ideal body weight.  Continue to monitor loop  recorder for atrial fibrillation 2. HTN: Advised to continue current treatment regimen.  Advised to continue to monitor at home along with continued follow-up with PCP for management 3. HLD: Advised to continue current treatment regimen along with continued follow-up with PCP for future prescribing and monitoring of lipid panel   Follow up in 6 months or call earlier if needed   Greater than 50% of time during this 45 minute non-face-to-face visit was spent on counseling, explanation of diagnosis of TIA, reviewing risk factor management of HTN and HLD, discussion regarding loop recorder, planning of further management along with potential future management, and discussion with patient and family answering all questions.    Venancio Poisson, AGNP-BC  [facility] [address] [place],  [address]  Phone [phone] Fax [phone] Note: This document was prepared with digital dictation and possible smart phrase technology. Any transcriptional errors that result from this process are unintentional.

## 2018-06-25 NOTE — Progress Notes (Signed)
I agree with the above plan 

## 2018-06-25 NOTE — Telephone Encounter (Signed)
Email link sent today at 751 am.

## 2018-07-04 ENCOUNTER — Ambulatory Visit (INDEPENDENT_AMBULATORY_CARE_PROVIDER_SITE_OTHER): Payer: Medicare Other | Admitting: Cardiology

## 2018-07-04 ENCOUNTER — Encounter: Payer: Self-pay | Admitting: Cardiology

## 2018-07-04 ENCOUNTER — Other Ambulatory Visit: Payer: Self-pay

## 2018-07-04 VITALS — BP 130/80 | HR 70 | Ht 68.0 in | Wt 151.0 lb

## 2018-07-04 DIAGNOSIS — G459 Transient cerebral ischemic attack, unspecified: Secondary | ICD-10-CM

## 2018-07-04 DIAGNOSIS — E7800 Pure hypercholesterolemia, unspecified: Secondary | ICD-10-CM | POA: Diagnosis not present

## 2018-07-04 DIAGNOSIS — Z9889 Other specified postprocedural states: Secondary | ICD-10-CM | POA: Diagnosis not present

## 2018-07-04 DIAGNOSIS — Z4509 Encounter for adjustment and management of other cardiac device: Secondary | ICD-10-CM | POA: Diagnosis not present

## 2018-07-04 DIAGNOSIS — R030 Elevated blood-pressure reading, without diagnosis of hypertension: Secondary | ICD-10-CM | POA: Diagnosis not present

## 2018-07-04 DIAGNOSIS — I669 Occlusion and stenosis of unspecified cerebral artery: Secondary | ICD-10-CM

## 2018-07-04 NOTE — Progress Notes (Signed)
Primary Physician/Referring:  Lajean Manes, MD/Pramod Leonie Man, MD  Patient ID: Joseph Pitts, male    DOB: Apr 19, 1945, 73 y.o.   MRN: [id number]  Chief Complaint  Patient presents with  . Hypertension  . Follow-up    HPI: Joseph Pitts  is a 73 y.o. male  with Hyperlipidemia, remote tobacco quit > 50 years ago,  history of TIA in 2013, history of left retinal vein occlusion in 2014, again presented to the emergency room on 04/08/2018 with a visual disturbance,, expressive aphasia  otherwise no other symptoms and episode resolved after 2 hours. He was evaluated in the emergency room, "stroke was called, he was admitted for observation for TIA.  Except for PVCs on telemetry and echocardiogram revealing no wall motion abnormality.  He underwent loop recorder implantation on 06/20/2018, states that he has not had any complications. He has not had any recurrence of TIA like symptoms. He remains active, no chest pain, no dyspnea, no palpitations. He exercises for 40 minutes on elliptical and also walks daily, does do weights as well.   Past Medical History:  Diagnosis Date  . Cancer ([place])    basal cell on nose removed  . Diverticulosis   . Hyperlipidemia   . Tubular adenoma of colon 2016  . Vascular occlusion    retinal vein occlusion    Past Surgical History:  Procedure Laterality Date  . COLONOSCOPY    . LOOP RECORDER IMPLANT  06/20/2018   Biotronik Loop Recorder. Serial # [id number]  for cryptogenic stroke   . TONSILLECTOMY      Social History   Socioeconomic History  . Marital status: Married    Spouse name: Not on file  . Number of children: 2  . Years of education: Not on file  . Highest education level: Not on file  Occupational History  . Occupation: retired  Scientific laboratory technician  . Financial resource strain: Not on file  . Food insecurity    Worry: Not on file    Inability: Not on file  . Transportation needs    Medical: Not on file    Non-medical: Not on file   Tobacco Use  . Smoking status: Former Smoker    Packs/day: 1.00    Years: 4.00    Pack years: 4.00    Types: Cigarettes    Quit date: 1969    Years since quitting: 51.4  . Smokeless tobacco: Never Used  Substance and Sexual Activity  . Alcohol use: Yes    Alcohol/week: 7.0 standard drinks    Types: 7 Cans of beer per week  . Drug use: No  . Sexual activity: Not on file  Lifestyle  . Physical activity    Days per week: Not on file    Minutes per session: Not on file  . Stress: Not on file  Relationships  . Social Herbalist on phone: Not on file    Gets together: Not on file    Attends religious service: Not on file    Active member of club or organization: Not on file    Attends meetings of clubs or organizations: Not on file    Relationship status: Not on file  . Intimate partner violence    Fear of current or ex partner: Not on file    Emotionally abused: Not on file    Physically abused: Not on file    Forced sexual activity: Not on file  Other Topics Concern  . Not on  file  Social History Narrative  . Not on file   Review of Systems  Constitution: Negative for chills, decreased appetite, malaise/fatigue and weight gain.  Eyes: Negative for blurred vision.  Cardiovascular: Negative for dyspnea on exertion, leg swelling and syncope.  Endocrine: Negative for cold intolerance.  Hematologic/Lymphatic: Does not bruise/bleed easily.  Musculoskeletal: Negative for joint swelling.  Gastrointestinal: Negative for abdominal pain, anorexia, change in bowel habit, hematochezia and melena.  Neurological: Negative for headaches and light-headedness.  Psychiatric/Behavioral: Negative for depression and substance abuse.  All other systems reviewed and are negative.     Objective  Blood pressure 130/80, pulse 70, height 5\' 8"  (1.727 m), weight 151 lb (68.5 kg), SpO2 97 %. Body mass index is 22.96 kg/m.    Physical Exam  Constitutional: He appears well-developed  and well-nourished. No distress.  HENT:  Head: Atraumatic.  Eyes: Conjunctivae are normal.  Neck: Neck supple. No JVD present. No thyromegaly present.  Cardiovascular: Normal rate, regular rhythm, normal heart sounds and intact distal pulses. Exam reveals no gallop.  No murmur heard. Pulmonary/Chest: Effort normal and breath sounds normal.  Loop recorder site noted  in the left parasternal region  Abdominal: Soft. Bowel sounds are normal.  Musculoskeletal: Normal range of motion.  Neurological: He is alert.  Skin: Skin is warm and dry.  Psychiatric: He has a normal mood and affect.   Radiology: No results found.  Laboratory examination:    CMP Latest Ref Rng & Units 04/08/2018 04/08/2018 02/09/2014  Glucose 70 - 99 mg/dL - 109(H) 91  BUN 8 - 23 mg/dL - 24(H) 21  Creatinine 0.61 - 1.24 mg/dL 1.00 1.10 1.04  Sodium 135 - 145 mmol/L - 137 138  Potassium 3.5 - 5.1 mmol/L - 4.3 4.5  Chloride 98 - 111 mmol/L - 103 102  CO2 22 - 32 mmol/L - 25 27  Calcium 8.9 - 10.3 mg/dL - 9.4 9.2  Total Protein 6.5 - 8.1 g/dL - 6.4(L) 6.6  Total Bilirubin 0.3 - 1.2 mg/dL - 0.9 0.6  Alkaline Phos 38 - 126 U/L - 103 94  AST 15 - 41 U/L - 34 26  ALT 0 - 44 U/L - 28 16   CBC Latest Ref Rng & Units 04/08/2018 02/09/2014 02/03/2013  WBC 4.0 - 10.5 K/uL 6.0 5.6 7.3  Hemoglobin 13.0 - 17.0 g/dL 15.0 13.8 16.2  Hematocrit 39.0 - 52.0 % 44.9 40.2 46.7  Platelets 150 - 400 K/uL 246 239 285   Lipid Panel     Component Value Date/Time   CHOL 152 04/09/2018 0248   TRIG 38 04/09/2018 0248   HDL 58 04/09/2018 0248   CHOLHDL 2.6 04/09/2018 0248   VLDL 8 04/09/2018 0248   LDLCALC 86 04/09/2018 0248   HEMOGLOBIN A1C Lab Results  Component Value Date   HGBA1C 5.3 04/09/2018   MPG 105.41 04/09/2018   TSH Recent Labs    04/08/18 1841  TSH 1.705    PRN Meds:. There are no discontinued medications. Current Meds  Medication Sig  . aspirin 81 MG tablet Take 81 mg by mouth daily.  Marland Kitchen atorvastatin  (LIPITOR) 40 MG tablet Take 1 tablet (40 mg total) by mouth daily.  . Cholecalciferol (VITAMIN D3) 25 MCG (1000 UT) CAPS Take 1,000 Units by mouth daily.  . Coenzyme Q10 (CO Q-10) 100 MG CAPS Take by mouth daily.  . Multiple Vitamin (MULTIVITAMIN) tablet Take 1 tablet by mouth daily.  Marland Kitchen OVER THE COUNTER MEDICATION Take 1 tablet by mouth  daily. Prostate plus health complex.    Cardiac Studies:   Echocardiogram 04/09/2018: 1. The left ventricle has normal systolic function with an ejection fraction of 60-65%. The cavity size was normal. Left ventricular diastolic function could not be evaluated.  2. The right ventricle has normal systolic function. The cavity was normal. There is no increase in right ventricular wall thickness.  3. Left atrial size was mildly dilated.  Assessment   TIA (transient ischemic attack)   Hypercholesteremia    Elevated BP without diagnosis of hypertension   Encounter for loop recorder check   Loop recorder: Biotronik Loop Recorder. Serial # [id number] in situ 06/20/18 for cryptogenic stroke   EKG 06/18/2018: Normal sinus rhythm at rate of 62 bpm, borderline criteria for left atrial enlargement, leftward axis, otherwise normal EKG.  Recommendations:   Patient now has loop recorder in situ after recurrent TIA, initial episode in 2013 and again in 2020 presenting with visual disturbances and expressive aphasia. In 2014 he has also had left central retinal vein occlusion that is incidental. Loop site has healed well.   He presents to the office for evaluation of elevated blood pressure noted previously, today the blood pressure is on the upper limit of normal, I did not start any therapy but advised the patient to follow-up on the blood pressure monitoring closely.  Lipids are being managed by his PCP, he was taking atorvastatin every other day but now has been taking it daily.  Now on aspirin indefinitely.  Has mild atherosclerotic changes by CTA, but no carotid  stenosis and MRI of the brain was normal. I will see him back in a year.   Adrian Prows, MD, [facility] 07/05/2018, 11:30 AM [facility]. [place] Pager: [phone] Office: [phone] If no answer Cell [phone]

## 2018-07-05 ENCOUNTER — Encounter: Payer: Self-pay | Admitting: Cardiology

## 2018-07-22 DIAGNOSIS — Z0000 Encounter for general adult medical examination without abnormal findings: Secondary | ICD-10-CM | POA: Diagnosis not present

## 2018-07-22 DIAGNOSIS — J300 Vasomotor rhinitis: Secondary | ICD-10-CM | POA: Diagnosis not present

## 2018-07-22 DIAGNOSIS — Z79899 Other long term (current) drug therapy: Secondary | ICD-10-CM | POA: Diagnosis not present

## 2018-07-22 DIAGNOSIS — Z1389 Encounter for screening for other disorder: Secondary | ICD-10-CM | POA: Diagnosis not present

## 2018-07-22 DIAGNOSIS — E7800 Pure hypercholesterolemia, unspecified: Secondary | ICD-10-CM | POA: Diagnosis not present

## 2018-07-29 DIAGNOSIS — E7800 Pure hypercholesterolemia, unspecified: Secondary | ICD-10-CM | POA: Diagnosis not present

## 2018-07-29 DIAGNOSIS — Z79899 Other long term (current) drug therapy: Secondary | ICD-10-CM | POA: Diagnosis not present

## 2018-07-29 DIAGNOSIS — Z125 Encounter for screening for malignant neoplasm of prostate: Secondary | ICD-10-CM | POA: Diagnosis not present

## 2018-07-29 DIAGNOSIS — Z0000 Encounter for general adult medical examination without abnormal findings: Secondary | ICD-10-CM | POA: Diagnosis not present

## 2018-07-30 DIAGNOSIS — Z95818 Presence of other cardiac implants and grafts: Secondary | ICD-10-CM | POA: Diagnosis not present

## 2018-07-30 DIAGNOSIS — Z8673 Personal history of transient ischemic attack (TIA), and cerebral infarction without residual deficits: Secondary | ICD-10-CM | POA: Diagnosis not present

## 2018-07-30 DIAGNOSIS — I4891 Unspecified atrial fibrillation: Secondary | ICD-10-CM | POA: Diagnosis not present

## 2018-07-30 DIAGNOSIS — Z4509 Encounter for adjustment and management of other cardiac device: Secondary | ICD-10-CM | POA: Diagnosis not present

## 2018-08-04 ENCOUNTER — Encounter: Payer: Self-pay | Admitting: Cardiology

## 2018-08-04 DIAGNOSIS — Z4509 Encounter for adjustment and management of other cardiac device: Secondary | ICD-10-CM

## 2018-08-04 HISTORY — DX: Encounter for adjustment and management of other cardiac device: Z45.09

## 2018-08-30 DIAGNOSIS — I639 Cerebral infarction, unspecified: Secondary | ICD-10-CM | POA: Diagnosis not present

## 2018-08-30 DIAGNOSIS — Z4509 Encounter for adjustment and management of other cardiac device: Secondary | ICD-10-CM | POA: Diagnosis not present

## 2018-08-30 DIAGNOSIS — I4891 Unspecified atrial fibrillation: Secondary | ICD-10-CM | POA: Diagnosis not present

## 2018-08-30 DIAGNOSIS — Z95818 Presence of other cardiac implants and grafts: Secondary | ICD-10-CM | POA: Diagnosis not present

## 2018-09-20 DIAGNOSIS — Z23 Encounter for immunization: Secondary | ICD-10-CM | POA: Diagnosis not present

## 2018-09-30 DIAGNOSIS — Z95818 Presence of other cardiac implants and grafts: Secondary | ICD-10-CM

## 2018-09-30 DIAGNOSIS — I639 Cerebral infarction, unspecified: Secondary | ICD-10-CM

## 2018-09-30 DIAGNOSIS — Z4509 Encounter for adjustment and management of other cardiac device: Secondary | ICD-10-CM

## 2018-09-30 DIAGNOSIS — I4891 Unspecified atrial fibrillation: Secondary | ICD-10-CM

## 2018-10-28 ENCOUNTER — Encounter: Payer: Self-pay | Admitting: Cardiology

## 2018-10-31 DIAGNOSIS — Z95818 Presence of other cardiac implants and grafts: Secondary | ICD-10-CM

## 2018-10-31 DIAGNOSIS — I639 Cerebral infarction, unspecified: Secondary | ICD-10-CM

## 2018-10-31 DIAGNOSIS — I4891 Unspecified atrial fibrillation: Secondary | ICD-10-CM

## 2018-10-31 DIAGNOSIS — Z4509 Encounter for adjustment and management of other cardiac device: Secondary | ICD-10-CM

## 2018-12-30 ENCOUNTER — Ambulatory Visit (INDEPENDENT_AMBULATORY_CARE_PROVIDER_SITE_OTHER): Payer: Medicare Other | Admitting: Adult Health

## 2018-12-30 ENCOUNTER — Other Ambulatory Visit: Payer: Self-pay

## 2018-12-30 ENCOUNTER — Encounter: Payer: Self-pay | Admitting: Adult Health

## 2018-12-30 VITALS — BP 122/70 | HR 73 | Temp 96.4°F | Ht 68.0 in | Wt 154.6 lb

## 2018-12-30 DIAGNOSIS — E785 Hyperlipidemia, unspecified: Secondary | ICD-10-CM

## 2018-12-30 DIAGNOSIS — G459 Transient cerebral ischemic attack, unspecified: Secondary | ICD-10-CM

## 2018-12-30 DIAGNOSIS — I10 Essential (primary) hypertension: Secondary | ICD-10-CM

## 2018-12-30 NOTE — Progress Notes (Signed)
I agree with the above plan 

## 2018-12-30 NOTE — Progress Notes (Signed)
[facility] [address] [place]. [place] [address] [phone]       OFFICE FOLLOW UP NOTE  [name] Date of Birth:  1945-03-24 Medical Record Number:  [id number]   Reason for Referral: TIA follow up   HPI:  Stroke admission 04/16/2018: [name] is a 73 y.o. male with history of HLD, CRAO and hx transient difficulty speaking presenting with transient difficulty reading and word finding difficulties.   Stroke work-up did not show acute findings therefore diagnosed with left brain TIA concerning for embolic source given to prior embolic events (TIA and CRAO).  2D echo unremarkable.  Unable to perform elective TEE or place loop recorder due to pandemic and recommended follow-up as outpatient.  LDL 86 and A1c 5.3.  Recommended DAPT for 3 weeks and aspirin alone.  HTN stable.  Other stroke risk factors include advanced age, former tobacco use, EtOH use and prior history of TIA.  Discharged home in stable condition without therapy needs. He returned to ED on 04/16/2018 after experiencing 5 minutes of blurred vision but no other neurological symptoms.  CT head obtained and was negative for acute infarct. They questioned whether sinus issues vs TIA.   Virtual visit 06/25/2018: He has been stable from a stroke standpoint without recurring or new stroke/TIA symptoms.  He did have loop recorder placed by Dr. Einar Gip on 06/20/2018 for long-term monitoring of potential atrial fibrillation.  Completed 3 weeks DAPT and continues on aspirin alone without side effects of bleeding or bruising.  Continues on atorvastatin without side effects myalgias. He was previously experiencing muscle pains and started on CoQ10 with benefit. Blood pressure monitored at home and typically 130s/70s. He has returned back to all prior activities without complications. He does have hx of migraine with visual aura. No recent migraines.  No further concerns at this time.  Denies new or worsening  stroke/TIA symptoms.      Update 12/30/2018: Joseph Pitts is a 73 year old male who is being seen today for stroke follow-up.  He has been doing well from a stroke standpoint without recurring stroke or TIA symptoms.  Loop recorder has not shown atrial fibrillation thus far.  Continues on aspirin 81 mg daily and atorvastatin for secondary stroke prevention without side effects.  Blood pressure today 122/70.  No concerns at this time.     ROS:   14 system review of systems performed and negative with exception of no complaints  PMH:  Past Medical History:  Diagnosis Date   Cancer ([place])    basal cell on nose removed   Diverticulosis    Encounter for loop recorder check 08/04/2018   Hyperlipidemia    Loop recorder in situ: Biotroik 06/20/18 for cryptogenic stroke.  06/20/2018   Scheduled Remote loop recorder check 07/30/2018: There were 0 symptomatic patient activations recorded. No mode switches. NSR. No A. Fib.   TIA (transient ischemic attack) 04/08/2018   Tubular adenoma of colon 2016   Vascular occlusion    retinal vein occlusion    PSH:  Past Surgical History:  Procedure Laterality Date   COLONOSCOPY     LOOP RECORDER IMPLANT  06/20/2018   Biotronik Loop Recorder. Serial # [id number]  for cryptogenic stroke    TONSILLECTOMY      Social History:  Social History   Socioeconomic History   Marital status: Married    Spouse name: Not on file   Number of children: 2   Years of education: Not on file  Highest education level: Not on file  Occupational History   Occupation: retired  Scientist, product/process development strain: Not on file   Food insecurity    Worry: Not on file    Inability: Not on Lexicographer needs    Medical: Not on file    Non-medical: Not on file  Tobacco Use   Smoking status: Former Smoker    Packs/day: 1.00    Years: 4.00    Pack years: 4.00    Types: Cigarettes    Quit date: 1969    Years since quitting: 51.9    Smokeless tobacco: Never Used  Substance and Sexual Activity   Alcohol use: Yes    Alcohol/week: 7.0 standard drinks    Types: 7 Cans of beer per week   Drug use: No   Sexual activity: Not on file  Lifestyle   Physical activity    Days per week: Not on file    Minutes per session: Not on file   Stress: Not on file  Relationships   Social connections    Talks on phone: Not on file    Gets together: Not on file    Attends religious service: Not on file    Active member of club or organization: Not on file    Attends meetings of clubs or organizations: Not on file    Relationship status: Not on file   Intimate partner violence    Fear of current or ex partner: Not on file    Emotionally abused: Not on file    Physically abused: Not on file    Forced sexual activity: Not on file  Other Topics Concern   Not on file  Social History Narrative   Not on file    Family History:  Family History  Problem Relation Age of Onset   Heart disease Mother    Prostate cancer Father    Arthritis Sister    Colon cancer Neg Hx    Esophageal cancer Neg Hx    Rectal cancer Neg Hx    Stomach cancer Neg Hx     Medications:   Current Outpatient Medications on File Prior to Visit  Medication Sig Dispense Refill   aspirin 81 MG tablet Take 81 mg by mouth daily.     atorvastatin (LIPITOR) 40 MG tablet Take 1 tablet (40 mg total) by mouth daily. 30 tablet 0   Cholecalciferol (VITAMIN D3) 25 MCG (1000 UT) CAPS Take 1,000 Units by mouth daily.     Coenzyme Q10 (CO Q-10) 100 MG CAPS Take by mouth daily.     Multiple Vitamin (MULTIVITAMIN) tablet Take 1 tablet by mouth daily.     OVER THE COUNTER MEDICATION Take 1 tablet by mouth daily. Prostate plus health complex.     No current facility-administered medications on file prior to visit.     Allergies:   Allergies  Allergen Reactions   Sulfa Antibiotics     Unknown.  "Mother told me not to take"     Physical  Exam  Today's Vitals   12/30/18 0924  BP: 122/70  Pulse: 73  Temp: (!) 96.4 F (35.8 C)  TempSrc: Oral  Weight: 154 lb 9.6 oz (70.1 kg)  Height: 5\' 8"  (1.727 m)   Body mass index is 23.51 kg/m.  General: well developed, well nourished,  pleasant elderly Caucasian male, seated, in no evident distress Head: head normocephalic and atraumatic.   Neck: supple with no carotid or supraclavicular bruits  Cardiovascular: regular rate and rhythm, no murmurs Musculoskeletal: no deformity Skin:  no rash/petichiae Vascular:  Normal pulses all extremities   Neurologic Exam Mental Status: Awake and fully alert. Oriented to place and time. Recent and remote memory intact. Attention span, concentration and fund of knowledge appropriate. Mood and affect appropriate.  Cranial Nerves: Pupils equal, briskly reactive to light. Extraocular movements full without nystagmus. Visual fields full to confrontation. Hearing intact. Facial sensation intact. Face, tongue, palate moves normally and symmetrically.  Motor: Normal bulk and tone. Normal strength in all tested extremity muscles. Sensory.: intact to touch , pinprick , position and vibratory sensation.  Coordination: Rapid alternating movements normal in all extremities. Finger-to-nose and heel-to-shin performed accurately bilaterally. Gait and Station: Arises from chair without difficulty. Stance is normal. Gait demonstrates normal stride length and balance Reflexes: 1+ and symmetric. Toes downgoing.       ASSESSMENT: Joseph Pitts is a 73 y.o. year old male here with left brain TIA on 04/08/18 secondary to likely embolic therefore loop recorder placed outpatient on 06/20/18.  Loop recorder has not shown atrial fibrillation thus far.  Vascular risk factors include HTN and HLD.  He has been stable without reoccurring or new stroke/TIA symptoms    PLAN:  1. TIA : Continue aspirin 81 mg daily  and atorvastatin 40mg  daily  for secondary stroke  prevention. Maintain strict control of hypertension with blood pressure goal below 130/90, diabetes with hemoglobin A1c goal below 6.5% and cholesterol with LDL cholesterol (bad cholesterol) goal below 70 mg/dL.  I also advised the patient to eat a healthy diet with plenty of whole grains, cereals, fruits and vegetables, exercise regularly with at least 30 minutes of continuous activity daily and maintain ideal body weight.  Continue to monitor loop recorder for possible atrial fibrillation 2. HTN: Advised to continue current treatment regimen.  Advised to continue to monitor at home along with continued follow-up with PCP for management 3. HLD: Advised to continue current treatment regimen along with continued follow-up with PCP for future prescribing and monitoring of lipid panel   Overall stable from stroke standpoint and recommend follow-up as needed  Greater than 50% of time during this 15 minute visit was spent on counseling, explanation of diagnosis of TIA, reviewing risk factor management of HTN and HLD, discussion regarding loop recorder, planning of further management along with potential future management, and discussion with patient and family answering all questions.    Frann Rider, AGNP-BC  [facility] [address] [place], [place] [address]  Phone [phone] Fax [phone] Note: This document was prepared with digital dictation and possible smart phrase technology. Any transcriptional errors that result from this process are unintentional.

## 2018-12-30 NOTE — Patient Instructions (Signed)
Continue aspirin 81 mg daily  and Lipitor for secondary stroke prevention  Continue to follow up with PCP regarding cholesterol and blood pressure management   Continue to follow with Dr. Einar Gip as scheduled and ongoing monitoring of loop recorder  Continue to monitor blood pressure at home  Maintain strict control of hypertension with blood pressure goal below 130/90, diabetes with hemoglobin A1c goal below 6.5% and cholesterol with LDL cholesterol (bad cholesterol) goal below 70 mg/dL. I also advised the patient to eat a healthy diet with plenty of whole grains, cereals, fruits and vegetables, exercise regularly and maintain ideal body weight.         Thank you for coming to see [place] at [facility]. I hope we have been able to provide you high quality care today.  You may receive a patient satisfaction survey over the next few weeks. We would appreciate your feedback and comments so that we may continue to improve ourselves and the health of our patients.

## 2019-01-01 DIAGNOSIS — G459 Transient cerebral ischemic attack, unspecified: Secondary | ICD-10-CM | POA: Diagnosis not present

## 2019-01-01 DIAGNOSIS — Z4509 Encounter for adjustment and management of other cardiac device: Secondary | ICD-10-CM | POA: Diagnosis not present

## 2019-01-01 DIAGNOSIS — Z95818 Presence of other cardiac implants and grafts: Secondary | ICD-10-CM | POA: Diagnosis not present

## 2019-02-01 DIAGNOSIS — Z4509 Encounter for adjustment and management of other cardiac device: Secondary | ICD-10-CM | POA: Diagnosis not present

## 2019-02-01 DIAGNOSIS — Z95818 Presence of other cardiac implants and grafts: Secondary | ICD-10-CM | POA: Diagnosis not present

## 2019-02-01 DIAGNOSIS — G459 Transient cerebral ischemic attack, unspecified: Secondary | ICD-10-CM | POA: Diagnosis not present

## 2019-02-19 ENCOUNTER — Ambulatory Visit: Payer: 59

## 2019-02-27 ENCOUNTER — Ambulatory Visit: Payer: No Typology Code available for payment source | Attending: Internal Medicine

## 2019-02-27 DIAGNOSIS — Z23 Encounter for immunization: Secondary | ICD-10-CM | POA: Insufficient documentation

## 2019-02-27 NOTE — Progress Notes (Addendum)
   Covid-19 Vaccination Clinic  Name:  Joseph Pitts    MRN: [id number] DOB: 1945-05-22  02/27/2019  [name] was observed post Covid-19 immunization for 15 minutes without incidence. He was provided with Vaccine Information Sheet and instruction to access the V-Safe system.   [name] was instructed to call 911 with any severe reactions post vaccine: Marland Kitchen Difficulty breathing  . Swelling of your face and throat  . A fast heartbeat  . A bad rash all over your body  . Dizziness and weakness    Immunizations Administered    Name Date Dose VIS Date Route   Pfizer COVID-19 Vaccine 02/27/2019  2:55 PM 0.3 mL 01/02/2019 Intramuscular   Manufacturer: [place]   Lot: [id number]   [place]: [id number]

## 2019-03-02 DIAGNOSIS — G459 Transient cerebral ischemic attack, unspecified: Secondary | ICD-10-CM | POA: Diagnosis not present

## 2019-03-02 DIAGNOSIS — Z4509 Encounter for adjustment and management of other cardiac device: Secondary | ICD-10-CM | POA: Diagnosis not present

## 2019-03-02 DIAGNOSIS — Z95818 Presence of other cardiac implants and grafts: Secondary | ICD-10-CM | POA: Diagnosis not present

## 2019-03-08 ENCOUNTER — Ambulatory Visit: Payer: 59

## 2019-03-09 ENCOUNTER — Ambulatory Visit (INDEPENDENT_AMBULATORY_CARE_PROVIDER_SITE_OTHER): Payer: Medicare Other

## 2019-03-09 ENCOUNTER — Other Ambulatory Visit: Payer: Self-pay

## 2019-03-09 ENCOUNTER — Ambulatory Visit (INDEPENDENT_AMBULATORY_CARE_PROVIDER_SITE_OTHER): Payer: Medicare Other | Admitting: Podiatry

## 2019-03-09 ENCOUNTER — Other Ambulatory Visit: Payer: Self-pay | Admitting: Podiatry

## 2019-03-09 ENCOUNTER — Encounter: Payer: Self-pay | Admitting: Podiatry

## 2019-03-09 DIAGNOSIS — M779 Enthesopathy, unspecified: Secondary | ICD-10-CM

## 2019-03-09 DIAGNOSIS — M2042 Other hammer toe(s) (acquired), left foot: Secondary | ICD-10-CM | POA: Diagnosis not present

## 2019-03-09 DIAGNOSIS — M2040 Other hammer toe(s) (acquired), unspecified foot: Secondary | ICD-10-CM

## 2019-03-09 MED ORDER — DICLOFENAC SODIUM 1 % EX GEL: 2.0000 g | Freq: Four times a day (QID) | CUTANEOUS | 2 refills | Status: DC

## 2019-03-17 NOTE — Progress Notes (Signed)
Subjective:   Patient ID: Joseph Pitts, male   DOB: 74 y.o.   MRN: [id number]   HPI 74 year old male presents the office today for concerns of pain to left fourth toe.  He states that the toe has been crooked most of his entire life but over the last 2 weeks he started get discomfort he started get burning pain in the toe as well.  He denies any recent injury or trauma.  He said no recent treatment.  He has no other concerns today.  Review of Systems  All other systems reviewed and are negative.  Past Medical History:  Diagnosis Date  . Cancer ([place])    basal cell on nose removed  . Diverticulosis   . Encounter for loop recorder check 08/04/2018  . Hyperlipidemia   . Loop recorder in situ: Biotroik 06/20/18 for cryptogenic stroke.  06/20/2018   Scheduled Remote loop recorder check 07/30/2018: There were 0 symptomatic patient activations recorded. No mode switches. NSR. No A. Fib.  Marland Kitchen TIA (transient ischemic attack) 04/08/2018  . Tubular adenoma of colon 2016  . Vascular occlusion    retinal vein occlusion    Past Surgical History:  Procedure Laterality Date  . COLONOSCOPY    . LOOP RECORDER IMPLANT  06/20/2018   Biotronik Loop Recorder. Serial # [id number]  for cryptogenic stroke   . TONSILLECTOMY       Current Outpatient Medications:  .  aspirin 81 MG tablet, Take 81 mg by mouth daily., Disp: , Rfl:  .  atorvastatin (LIPITOR) 40 MG tablet, Take 1 tablet (40 mg total) by mouth daily., Disp: 30 tablet, Rfl: 0 .  Cholecalciferol (VITAMIN D3) 25 MCG (1000 UT) CAPS, Take 1,000 Units by mouth daily., Disp: , Rfl:  .  Coenzyme Q10 (CO Q-10) 100 MG CAPS, Take by mouth daily., Disp: , Rfl:  .  Multiple Vitamin (MULTIVITAMIN) tablet, Take 1 tablet by mouth daily., Disp: , Rfl:  .  OVER THE COUNTER MEDICATION, Take 1 tablet by mouth daily. Prostate plus health complex., Disp: , Rfl:  .  diclofenac Sodium (VOLTAREN) 1 % GEL, Apply 2 g topically 4 (four) times daily. Rub into affected area  of foot 2 to 4 times daily, Disp: 100 g, Rfl: 2  Allergies  Allergen Reactions  . Sulfa Antibiotics     Unknown.  "Mother told me not to take"         Objective:  Physical Exam  General: AAO x3, NAD  Dermatological: Skin is warm, dry and supple bilateral. Nails x 10 are well manicured; remaining integument appears unremarkable at this time. There are no open sores, no preulcerative lesions, no rash or signs of infection present.  Vascular: Dorsalis Pedis artery and Posterior Tibial artery pedal pulses are 2/4 bilateral with immedate capillary fill time. Pedal hair growth present. No varicosities and no lower extremity edema present bilateral. There is no pain with calf compression, swelling, warmth, erythema.   Neruologic: Grossly intact via light touch bilateral. Protective threshold with Semmes Wienstein monofilament intact to all pedal sites bilateral.  Musculoskeletal: Adductovarus is present in the left fourth toe.  There is tenderness along the digit itself and along the MPJ.  There is no palpable neuroma identified.  There is no area of pain to the metatarsal otherwise.  There is no other areas of discomfort.  Muscular strength 5/5 in all groups tested bilateral.  Gait: Unassisted, Nonantalgic.       Assessment:   74 year old male left fourth toe  deformity, MPJ capsulitis     Plan:  -Treatment options discussed including all alternatives, risks, and complications -Etiology of symptoms were discussed -X-rays were obtained and reviewed with the patient.  Deformity present of the fourth toe.  There is no evidence of acute fracture today. -Discussed steroid injection he wanted to hold off on this.  Dispensed offloading pads.  Discussed supportive shoes, inserts.  Voltaren gel as needed.  Return if symptoms worsen or fail to improve.  Trula Slade DPM

## 2019-03-19 DIAGNOSIS — H61002 Unspecified perichondritis of left external ear: Secondary | ICD-10-CM | POA: Diagnosis not present

## 2019-03-19 DIAGNOSIS — L820 Inflamed seborrheic keratosis: Secondary | ICD-10-CM | POA: Diagnosis not present

## 2019-03-19 DIAGNOSIS — L570 Actinic keratosis: Secondary | ICD-10-CM | POA: Diagnosis not present

## 2019-03-19 DIAGNOSIS — L821 Other seborrheic keratosis: Secondary | ICD-10-CM | POA: Diagnosis not present

## 2019-03-19 DIAGNOSIS — Z85828 Personal history of other malignant neoplasm of skin: Secondary | ICD-10-CM | POA: Diagnosis not present

## 2019-03-19 DIAGNOSIS — L814 Other melanin hyperpigmentation: Secondary | ICD-10-CM | POA: Diagnosis not present

## 2019-03-19 DIAGNOSIS — D225 Melanocytic nevi of trunk: Secondary | ICD-10-CM | POA: Diagnosis not present

## 2019-03-24 ENCOUNTER — Ambulatory Visit: Payer: No Typology Code available for payment source | Attending: Internal Medicine

## 2019-03-24 DIAGNOSIS — Z23 Encounter for immunization: Secondary | ICD-10-CM

## 2019-03-24 NOTE — Progress Notes (Signed)
   Covid-19 Vaccination Clinic  Name:  Joseph Pitts    MRN: [id number] DOB: 1945-03-30  03/24/2019  [name] was observed post Covid-19 immunization for 15 minutes without incident. He was provided with Vaccine Information Sheet and instruction to access the V-Safe system.   [name] was instructed to call 911 with any severe reactions post vaccine: Marland Kitchen Difficulty breathing  . Swelling of face and throat  . A fast heartbeat  . A bad rash all over body  . Dizziness and weakness   Immunizations Administered    Name Date Dose VIS Date Route   Pfizer COVID-19 Vaccine 03/24/2019  2:39 PM 0.3 mL 01/02/2019 Intramuscular   Manufacturer: [place]   Lot: [id number]   [place]: [id number]

## 2019-04-04 DIAGNOSIS — G459 Transient cerebral ischemic attack, unspecified: Secondary | ICD-10-CM | POA: Diagnosis not present

## 2019-04-04 DIAGNOSIS — Z95818 Presence of other cardiac implants and grafts: Secondary | ICD-10-CM | POA: Diagnosis not present

## 2019-04-04 DIAGNOSIS — Z4509 Encounter for adjustment and management of other cardiac device: Secondary | ICD-10-CM | POA: Diagnosis not present

## 2019-04-06 DIAGNOSIS — G43109 Migraine with aura, not intractable, without status migrainosus: Secondary | ICD-10-CM | POA: Diagnosis not present

## 2019-04-06 DIAGNOSIS — H0102B Squamous blepharitis left eye, upper and lower eyelids: Secondary | ICD-10-CM | POA: Diagnosis not present

## 2019-04-06 DIAGNOSIS — D492 Neoplasm of unspecified behavior of bone, soft tissue, and skin: Secondary | ICD-10-CM | POA: Diagnosis not present

## 2019-04-06 DIAGNOSIS — H0102A Squamous blepharitis right eye, upper and lower eyelids: Secondary | ICD-10-CM | POA: Diagnosis not present

## 2019-04-06 DIAGNOSIS — H2513 Age-related nuclear cataract, bilateral: Secondary | ICD-10-CM | POA: Diagnosis not present

## 2019-04-06 DIAGNOSIS — H348332 Tributary (branch) retinal vein occlusion, bilateral, stable: Secondary | ICD-10-CM | POA: Diagnosis not present

## 2019-04-06 DIAGNOSIS — H35352 Cystoid macular degeneration, left eye: Secondary | ICD-10-CM | POA: Diagnosis not present

## 2019-05-05 ENCOUNTER — Encounter: Payer: Self-pay | Admitting: Gastroenterology

## 2019-05-05 DIAGNOSIS — Z4509 Encounter for adjustment and management of other cardiac device: Secondary | ICD-10-CM | POA: Diagnosis not present

## 2019-05-05 DIAGNOSIS — G459 Transient cerebral ischemic attack, unspecified: Secondary | ICD-10-CM | POA: Diagnosis not present

## 2019-05-05 DIAGNOSIS — Z95818 Presence of other cardiac implants and grafts: Secondary | ICD-10-CM | POA: Diagnosis not present

## 2019-06-02 ENCOUNTER — Ambulatory Visit: Payer: Medicare Other | Admitting: Podiatry

## 2019-06-24 DIAGNOSIS — Z125 Encounter for screening for malignant neoplasm of prostate: Secondary | ICD-10-CM | POA: Diagnosis not present

## 2019-06-24 DIAGNOSIS — E7800 Pure hypercholesterolemia, unspecified: Secondary | ICD-10-CM | POA: Diagnosis not present

## 2019-06-24 DIAGNOSIS — Z0000 Encounter for general adult medical examination without abnormal findings: Secondary | ICD-10-CM | POA: Diagnosis not present

## 2019-06-24 DIAGNOSIS — R1084 Generalized abdominal pain: Secondary | ICD-10-CM | POA: Diagnosis not present

## 2019-06-26 DIAGNOSIS — R1084 Generalized abdominal pain: Secondary | ICD-10-CM | POA: Diagnosis not present

## 2019-06-30 ENCOUNTER — Ambulatory Visit (AMBULATORY_SURGERY_CENTER): Payer: Self-pay | Admitting: *Deleted

## 2019-06-30 ENCOUNTER — Other Ambulatory Visit: Payer: Self-pay

## 2019-06-30 VITALS — Ht 68.0 in | Wt 154.0 lb

## 2019-06-30 DIAGNOSIS — Z86010 Personal history of colonic polyps: Secondary | ICD-10-CM

## 2019-06-30 MED ORDER — SUTAB 1479-225-188 MG PO TABS: 1.0000 | ORAL_TABLET | ORAL | 0 refills | Status: DC

## 2019-06-30 NOTE — Progress Notes (Signed)
Patient is here in-person for PV. Patient denies any allergies to eggs or soy. Patient denies any problems with anesthesia/sedation. Patient denies any oxygen use at home. Patient denies taking any diet/weight loss medications or blood thinners. Patient is not being treated for MRSA or C-diff. Patient is aware of our care-partner policy and DEKIY-[id number] safety protocol.completed both covid vaccines per pt on 03/24/19. Prep Prescription coupon given to the patient. OV made for the patient to see Dr.Stark per pt's request for stomach cramps.

## 2019-07-08 ENCOUNTER — Ambulatory Visit: Payer: Medicare Other | Admitting: Cardiology

## 2019-07-14 ENCOUNTER — Other Ambulatory Visit: Payer: Self-pay

## 2019-07-14 ENCOUNTER — Ambulatory Visit (AMBULATORY_SURGERY_CENTER): Payer: Medicare Other | Admitting: Gastroenterology

## 2019-07-14 ENCOUNTER — Encounter: Payer: Self-pay | Admitting: Gastroenterology

## 2019-07-14 VITALS — BP 121/76 | HR 56 | Temp 96.6°F | Resp 12 | Ht 68.0 in | Wt 154.0 lb

## 2019-07-14 DIAGNOSIS — D123 Benign neoplasm of transverse colon: Secondary | ICD-10-CM | POA: Diagnosis not present

## 2019-07-14 DIAGNOSIS — Z86010 Personal history of colonic polyps: Secondary | ICD-10-CM

## 2019-07-14 DIAGNOSIS — Z1211 Encounter for screening for malignant neoplasm of colon: Secondary | ICD-10-CM | POA: Diagnosis not present

## 2019-07-14 MED ORDER — SODIUM CHLORIDE 0.9 % IV SOLN
500.0000 mL | Freq: Once | INTRAVENOUS | Status: DC
Start: 2019-07-14 — End: 2019-07-14

## 2019-07-14 NOTE — Progress Notes (Signed)
Pt's states no medical or surgical changes since previsit or office visit. 

## 2019-07-14 NOTE — Op Note (Signed)
[place] Patient Name: Joseph Pitts Procedure Date: 07/14/2019 9:31 AM MRN: [id number] Endoscopist: Ladene Artist , MD Age: 74 Referring MD:  Date of Birth: 21-Aug-1945 Gender: Male Account #: [id number] Procedure:                Colonoscopy Indications:              Surveillance: Personal history of adenomatous                            polyps on last colonoscopy 5 years ago Medicines:                Monitored Anesthesia Care Procedure:                Pre-Anesthesia Assessment:                           - Prior to the procedure, a History and Physical                            was performed, and patient medications and                            allergies were reviewed. The patient's tolerance of                            previous anesthesia was also reviewed. The risks                            and benefits of the procedure and the sedation                            options and risks were discussed with the patient.                            All questions were answered, and informed consent                            was obtained. Prior Anticoagulants: The patient has                            taken no previous anticoagulant or antiplatelet                            agents. ASA Grade Assessment: III - A patient with                            severe systemic disease. After reviewing the risks                            and benefits, the patient was deemed in                            satisfactory condition to undergo the procedure.  After obtaining informed consent, the colonoscope                            was passed under direct vision. Throughout the                            procedure, the patient's blood pressure, pulse, and                            oxygen saturations were monitored continuously. The                            Colonoscope was introduced through the anus and                            advanced to the the  cecum, identified by                            appendiceal orifice and ileocecal valve. The                            ileocecal valve, appendiceal orifice, and rectum                            were photographed. The quality of the bowel                            preparation was good. The colonoscopy was somewhat                            difficult due to a redundant colon, significant                            looping and a tortuous colon. The patient tolerated                            the procedure well. Scope In: 9:42:00 AM Scope Out: 9:57:18 AM Scope Withdrawal Time: 0 hours 9 minutes 2 seconds  Total Procedure Duration: 0 hours 15 minutes 18 seconds  Findings:                 The perianal and digital rectal examinations were                            normal.                           A 6 mm polyp was found in the transverse colon. The                            polyp was sessile. The polyp was removed with a                            cold snare. Resection and retrieval were complete.  Multiple medium-mouthed diverticula were found in                            the entire colon. There was no evidence of                            diverticular bleeding.                           Internal hemorrhoids were found during                            retroflexion. The hemorrhoids were small and Grade                            I (internal hemorrhoids that do not prolapse).                           The exam was otherwise without abnormality on                            direct and retroflexion views. Complications:            No immediate complications. Estimated blood loss:                            None. Estimated Blood Loss:     Estimated blood loss: none. Impression:               - One 6 mm polyp in the transverse colon, removed                            with a cold snare. Resected and retrieved.                           - Moderate diverticulosis in  the entire examined                            colon.                           - Internal hemorrhoids.                           - The examination was otherwise normal on direct                            and retroflexion views. Recommendation:           - Consider repeat colonoscopy date to be determined                            after pending pathology results are reviewed for                            surveillance based on pathology results vs no  future surveillance colonoscopies due to age.                           - Patient has a contact number available for                            emergencies. The signs and symptoms of potential                            delayed complications were discussed with the                            patient. Return to normal activities tomorrow.                            Written discharge instructions were provided to the                            patient.                           - High fiber diet.                           - Continue present medications.                           - Await pathology results. Ladene Artist, MD 07/14/2019 10:06:19 AM This report has been signed electronically.

## 2019-07-14 NOTE — Progress Notes (Signed)
pt tolerated well. VSS. awake and to recovery. Report given to RN.  

## 2019-07-14 NOTE — Progress Notes (Signed)
Called to room to assist during endoscopic procedure.  Patient ID and intended procedure confirmed with present staff. Received instructions for my participation in the procedure from the performing physician.  

## 2019-07-14 NOTE — Patient Instructions (Signed)
Information on polyps, diverticulosis and hemorrhoids given to you today.  Await pathology results.  Resume previous diet and medications.  Eat a high fiber diet.  YOU HAD AN ENDOSCOPIC PROCEDURE TODAY AT THE [facility]:   Refer to the procedure report that was given to you for any specific questions about what was found during the examination.  If the procedure report does not answer your questions, please call your gastroenterologist to clarify.  If you requested that your care partner not be given the details of your procedure findings, then the procedure report has been included in a sealed envelope for you to review at your convenience later.  YOU SHOULD EXPECT: Some feelings of bloating in the abdomen. Passage of more gas than usual.  Walking can help get rid of the air that was put into your GI tract during the procedure and reduce the bloating. If you had a lower endoscopy (such as a colonoscopy or flexible sigmoidoscopy) you may notice spotting of blood in your stool or on the toilet paper. If you underwent a bowel prep for your procedure, you may not have a normal bowel movement for a few days.  Please Note:  You might notice some irritation and congestion in your nose or some drainage.  This is from the oxygen used during your procedure.  There is no need for concern and it should clear up in a day or so.  SYMPTOMS TO REPORT IMMEDIATELY:   Following lower endoscopy (colonoscopy or flexible sigmoidoscopy):  Excessive amounts of blood in the stool  Significant tenderness or worsening of abdominal pains  Swelling of the abdomen that is new, acute  Fever of 100F or higher  For urgent or emergent issues, a gastroenterologist can be reached at any hour by calling [phone]. Do not use MyChart messaging for urgent concerns.    DIET:  We do recommend a small meal at first, but then you may proceed to your regular diet.  Drink plenty of fluids but you should avoid  alcoholic beverages for 24 hours.  ACTIVITY:  You should plan to take it easy for the rest of today and you should NOT DRIVE or use heavy machinery until tomorrow (because of the sedation medicines used during the test).    FOLLOW UP: Our staff will call the number listed on your records 48-72 hours following your procedure to check on you and address any questions or concerns that you may have regarding the information given to you following your procedure. If we do not reach you, we will leave a message.  We will attempt to reach you two times.  During this call, we will ask if you have developed any symptoms of COVID 19. If you develop any symptoms (ie: fever, flu-like symptoms, shortness of breath, cough etc.) before then, please call [phone].  If you test positive for Covid 19 in the 2 weeks post procedure, please call and report this information to us.    If any biopsies were taken you will be contacted by phone or by letter within the next 1-3 weeks.  Please call us at [phone] if you have not heard about the biopsies in 3 weeks.    SIGNATURES/CONFIDENTIALITY: You and/or your care partner have signed paperwork which will be entered into your electronic medical record.  These signatures attest to the fact that that the information above on your After Visit Summary has been reviewed and is understood.  Full responsibility of the confidentiality of this   information lies with you and/or your care-partner.

## 2019-07-16 ENCOUNTER — Telehealth: Payer: Self-pay | Admitting: *Deleted

## 2019-07-16 NOTE — Telephone Encounter (Signed)
1. Have you developed a fever since your procedure? no  2.   Have you had an respiratory symptoms (SOB or cough) since your procedure? no  3.   Have you tested positive for COVID 19 since your procedure no  4.   Have you had any family members/close contacts diagnosed with the COVID 19 since your procedure?  no   If yes to any of these questions please route to Joylene John, RN and Erenest Rasher, RN Follow up Call-  Call back number 07/14/2019  Post procedure Call Back phone  # [phone]  Permission to leave phone message Yes  Some recent data might be hidden     Patient questions:  Do you have a fever, pain , or abdominal swelling? No. Pain Score  0 *  Have you tolerated food without any problems? Yes.    Have you been able to return to your normal activities? Yes.    Do you have any questions about your discharge instructions: Diet   No. Medications  No. Follow up visit  No.  Do you have questions or concerns about your Care? No.  Actions: * If pain score is 4 or above: No action needed, pain <4.

## 2019-07-22 ENCOUNTER — Other Ambulatory Visit: Payer: Self-pay

## 2019-07-22 ENCOUNTER — Encounter: Payer: Self-pay | Admitting: Cardiology

## 2019-07-22 ENCOUNTER — Ambulatory Visit: Payer: No Typology Code available for payment source | Admitting: Cardiology

## 2019-07-22 ENCOUNTER — Encounter: Payer: Self-pay | Admitting: Gastroenterology

## 2019-07-22 VITALS — BP 132/84 | HR 69 | Resp 16 | Ht 68.0 in | Wt 151.0 lb

## 2019-07-22 DIAGNOSIS — E7800 Pure hypercholesterolemia, unspecified: Secondary | ICD-10-CM

## 2019-07-22 DIAGNOSIS — Z9889 Other specified postprocedural states: Secondary | ICD-10-CM

## 2019-07-22 DIAGNOSIS — R030 Elevated blood-pressure reading, without diagnosis of hypertension: Secondary | ICD-10-CM

## 2019-07-22 DIAGNOSIS — G459 Transient cerebral ischemic attack, unspecified: Secondary | ICD-10-CM

## 2019-07-22 NOTE — Progress Notes (Signed)
Primary Physician/Referring:  Lajean Manes, MD/Pramod Leonie Man, MD  Patient ID: Joseph Pitts, male    DOB: 1945-01-24, 74 y.o.   MRN: [id number]  Chief Complaint  Patient presents with  . Follow-up    6 months  . Transient Ischemic Attack  . Hypertension    HPI: Joseph Pitts  is a 74 y.o. male  with Hyperlipidemia, remote tobacco quit > 50 years ago,  history of TIA in 2013, history of left retinal vein occlusion in 2014, again presented to the emergency room on 04/08/2018 with a visual disturbance, expressive aphasia  otherwise no other symptoms and episode resolved after 2 hours. He was evaluated in the emergency room, "stroke was called, he was admitted for observation for TIA. He has remote history of complex migraine and used to have visual loss, but has not had recurrence in years.   Except for PVCs on telemetry and echocardiogram revealing no wall motion abnormality.  He underwent loop recorder implantation on 06/20/2018,  He has not had any recurrence of TIA like symptoms. He remains active, no chest pain, no dyspnea, no palpitations. He exercises for 40 minutes on elliptical and also walks daily, does do weights as well.   Past Medical History:  Diagnosis Date  . Cancer ([place])    basal cell on nose removed  . Diverticulosis   . Encounter for loop recorder check 08/04/2018  . Hyperlipidemia   . Loop recorder in situ: Biotroik 06/20/18 for cryptogenic stroke.  06/20/2018   Scheduled Remote loop recorder check 07/30/2018: There were 0 symptomatic patient activations recorded. No mode switches. NSR. No A. Fib.  Marland Kitchen TIA (transient ischemic attack) 04/08/2018  . Tubular adenoma of colon 2016  . Vascular occlusion    retinal vein occlusion    Past Surgical History:  Procedure Laterality Date  . COLONOSCOPY  04/30/2014  . LOOP RECORDER IMPLANT  06/20/2018   Biotronik Loop Recorder. Serial # [id number]  for cryptogenic stroke   . POLYPECTOMY    . TONSILLECTOMY     Social  History   Tobacco Use  . Smoking status: Former Smoker    Packs/day: 1.00    Years: 4.00    Pack years: 4.00    Types: Cigarettes    Quit date: 1969    Years since quitting: 52.5  . Smokeless tobacco: Never Used  Substance Use Topics  . Alcohol use: Yes    Alcohol/week: 3.0 standard drinks    Types: 3 Cans of beer per week    Comment: Weekly   Marital Status: Married   Review of Systems  Cardiovascular: Negative for chest pain, dyspnea on exertion and leg swelling.  Gastrointestinal: Negative for melena.      Objective  Blood pressure 132/84, pulse 69, resp. rate 16, height 5\' 8"  (1.727 m), weight 151 lb (68.5 kg), SpO2 98 %. Body mass index is 22.96 kg/m.    Physical Exam Constitutional:      General: He is not in acute distress.    Appearance: Normal appearance. He is well-developed and normal weight.  Neck:     Thyroid: No thyromegaly.     Vascular: No JVD.  Cardiovascular:     Rate and Rhythm: Normal rate and regular rhythm.     Pulses: Intact distal pulses.     Heart sounds: Normal heart sounds. No murmur heard.  No gallop.   Pulmonary:     Effort: Pulmonary effort is normal.     Breath sounds: Normal breath sounds.  Abdominal:     General: Bowel sounds are normal.     Palpations: Abdomen is soft.    Radiology: No results found.  Laboratory examination:    CMP Latest Ref Rng & Units 04/08/2018 04/08/2018 02/09/2014  Glucose 70 - 99 mg/dL - 109(H) 91  BUN 8 - 23 mg/dL - 24(H) 21  Creatinine 0.61 - 1.24 mg/dL 1.00 1.10 1.04  Sodium 135 - 145 mmol/L - 137 138  Potassium 3.5 - 5.1 mmol/L - 4.3 4.5  Chloride 98 - 111 mmol/L - 103 102  CO2 22 - 32 mmol/L - 25 27  Calcium 8.9 - 10.3 mg/dL - 9.4 9.2  Total Protein 6.5 - 8.1 g/dL - 6.4(L) 6.6  Total Bilirubin 0.3 - 1.2 mg/dL - 0.9 0.6  Alkaline Phos 38 - 126 U/L - 103 94  AST 15 - 41 U/L - 34 26  ALT 0 - 44 U/L - 28 16   CBC Latest Ref Rng & Units 04/08/2018 02/09/2014 02/03/2013  WBC 4.0 - 10.5 K/uL 6.0 5.6  7.3  Hemoglobin 13.0 - 17.0 g/dL 15.0 13.8 16.2  Hematocrit 39 - 52 % 44.9 40.2 46.7  Platelets 150 - 400 K/uL 246 239 285   Lipid Panel     Component Value Date/Time   CHOL 152 04/09/2018 0248   TRIG 38 04/09/2018 0248   HDL 58 04/09/2018 0248   CHOLHDL 2.6 04/09/2018 0248   VLDL 8 04/09/2018 0248   LDLCALC 86 04/09/2018 0248   HEMOGLOBIN A1C Lab Results  Component Value Date   HGBA1C 5.3 04/09/2018   MPG 105.41 04/09/2018   TSH No results for input(s): TSH in the last 8760 hours.   External labs;  Cholesterol, total 152.000 06/24/2019 Triglycerides 55.000 06/24/2019 HDL 67.000 06/24/2019 LDL-C 74.000 06/24/2019   A1C 5.300 04/09/2018  Hemoglobin 13.100 06/24/2019  Creatinine, Serum 1.030 06/24/2019 Potassium 4.600 06/24/2019 ALT (SGPT) 20.000 06/24/2019  PRN Meds:. There are no discontinued medications. Current Meds  Medication Sig  . aspirin 81 MG tablet Take 81 mg by mouth daily.  Marland Kitchen atorvastatin (LIPITOR) 40 MG tablet Take 1 tablet (40 mg total) by mouth daily.  . Cholecalciferol (VITAMIN D3) 25 MCG (1000 UT) CAPS Take 1,000 Units by mouth daily.  . Coenzyme Q10 (CO Q-10) 100 MG CAPS Take by mouth daily.  . diclofenac Sodium (VOLTAREN) 1 % GEL Apply 2 g topically 4 (four) times daily. Rub into affected area of foot 2 to 4 times daily  . Multiple Vitamin (MULTIVITAMIN) tablet Take 1 tablet by mouth daily.  Marland Kitchen OVER THE COUNTER MEDICATION Take 1 tablet by mouth daily. Prostate plus health complex.    Cardiac Studies:   Echocardiogram 04/09/2018: 1. The left ventricle has normal systolic function with an ejection fraction of 60-65%. The cavity size was normal. Left ventricular diastolic function could not be evaluated.  2. The right ventricle has normal systolic function. The cavity was normal. There is no increase in right ventricular wall thickness.  3. Left atrial size was mildly dilated.  Scheduled Remote loop recorder check 07/03/2019:  There were 0 symptomatic patient  activations recorded. No mode switches. NSR. Uneventful monitoring. 1 tachy episode is oversensing.   EKG:  EKG 07/22/2019: Sinus bradycardia at rate of 56 bpm, borderline left atrial abnormality, left axis deviation, left anterior fascicular block.  Poor R wave progression, cannot exclude anteroseptal infarct old.  No evidence of ischemia.  Normal QT interval.  No significant change from 06/18/2018.  Assessment  ICD-10-CM   1. TIA (transient ischemic attack)  G45.9 EKG 12-Lead  2. Hypercholesteremia  E78.00   3. Elevated BP without diagnosis of hypertension  R03.0   4. Loop recorder: [employer]. Serial # [id number] in situ 06/20/18 for cryptogenic stroke  Z98.890      Recommendations:   Joseph Pitts  is a 74 y.o. male  with Hyperlipidemia, remote tobacco quit > 50 years ago,  history of TIA in 2013, history of left retinal vein occlusion in 2014, again presented to the emergency room on 04/08/2018 with a visual disturbance, expressive aphasia  otherwise no other symptoms and episode resolved after 2 hours. He was evaluated in the emergency room, "stroke was called, he was admitted for observation for TIA. He has remote history of complex migraine and used to have visual loss, but has not had recurrence in years.  He has mild atherosclerotic changes by CTA of the brain, as he is presently on atorvastatin which is tolerating and lipids are much improved and essentially normal.  Blood pressure again today is minimally elevated, advised him to continue to trend the blood pressure on a daily basis for the next 30 days and if blood pressure is >130/80 mmHg, consider adding losartan 25 mg in the evening which also be vascular protective and may also potentially help with his "migraine".  From loop recorder standpoint he has not had any atrial fibrillation or heart block.  He continues to remain active.  I will see him back on a as needed basis and will continue to monitor his loop  recorder on a remote basis.  Patient is agreeable.  I reviewed his external labs, updated them.   Adrian Prows, MD, [facility] 07/22/2019, 2:36 PM Office: [phone]

## 2019-08-04 DIAGNOSIS — G459 Transient cerebral ischemic attack, unspecified: Secondary | ICD-10-CM | POA: Diagnosis not present

## 2019-08-04 DIAGNOSIS — Z4509 Encounter for adjustment and management of other cardiac device: Secondary | ICD-10-CM | POA: Diagnosis not present

## 2019-08-04 DIAGNOSIS — Z95818 Presence of other cardiac implants and grafts: Secondary | ICD-10-CM | POA: Diagnosis not present

## 2019-08-24 DIAGNOSIS — R3912 Poor urinary stream: Secondary | ICD-10-CM | POA: Diagnosis not present

## 2019-08-24 DIAGNOSIS — N401 Enlarged prostate with lower urinary tract symptoms: Secondary | ICD-10-CM | POA: Diagnosis not present

## 2019-08-25 ENCOUNTER — Ambulatory Visit: Payer: Medicare Other | Admitting: Gastroenterology

## 2019-10-12 DIAGNOSIS — Z23 Encounter for immunization: Secondary | ICD-10-CM | POA: Diagnosis not present

## 2019-11-23 DIAGNOSIS — Z4509 Encounter for adjustment and management of other cardiac device: Secondary | ICD-10-CM | POA: Diagnosis not present

## 2019-11-23 DIAGNOSIS — Z95818 Presence of other cardiac implants and grafts: Secondary | ICD-10-CM | POA: Diagnosis not present

## 2019-11-23 DIAGNOSIS — G459 Transient cerebral ischemic attack, unspecified: Secondary | ICD-10-CM | POA: Diagnosis not present

## 2019-12-24 DIAGNOSIS — Z4509 Encounter for adjustment and management of other cardiac device: Secondary | ICD-10-CM | POA: Diagnosis not present

## 2019-12-24 DIAGNOSIS — G459 Transient cerebral ischemic attack, unspecified: Secondary | ICD-10-CM | POA: Diagnosis not present

## 2019-12-24 DIAGNOSIS — Z95818 Presence of other cardiac implants and grafts: Secondary | ICD-10-CM | POA: Diagnosis not present

## 2020-01-24 DIAGNOSIS — Z4509 Encounter for adjustment and management of other cardiac device: Secondary | ICD-10-CM | POA: Diagnosis not present

## 2020-01-24 DIAGNOSIS — G459 Transient cerebral ischemic attack, unspecified: Secondary | ICD-10-CM | POA: Diagnosis not present

## 2020-01-24 DIAGNOSIS — Z95818 Presence of other cardiac implants and grafts: Secondary | ICD-10-CM | POA: Diagnosis not present

## 2020-02-15 ENCOUNTER — Telehealth: Payer: Self-pay

## 2020-02-15 NOTE — Telephone Encounter (Signed)
Pt called in stating that he received something from [employer] in the mail and wasn't sure what he was supposed to do. Can you please call him since [facility] is out of office.

## 2020-02-24 DIAGNOSIS — G459 Transient cerebral ischemic attack, unspecified: Secondary | ICD-10-CM | POA: Diagnosis not present

## 2020-02-24 DIAGNOSIS — Z4509 Encounter for adjustment and management of other cardiac device: Secondary | ICD-10-CM | POA: Diagnosis not present

## 2020-02-24 DIAGNOSIS — Z95818 Presence of other cardiac implants and grafts: Secondary | ICD-10-CM | POA: Diagnosis not present

## 2020-03-03 NOTE — Telephone Encounter (Signed)
Please see the attached names for loop

## 2020-03-14 DIAGNOSIS — L281 Prurigo nodularis: Secondary | ICD-10-CM | POA: Diagnosis not present

## 2020-03-14 DIAGNOSIS — L814 Other melanin hyperpigmentation: Secondary | ICD-10-CM | POA: Diagnosis not present

## 2020-03-14 DIAGNOSIS — L570 Actinic keratosis: Secondary | ICD-10-CM | POA: Diagnosis not present

## 2020-03-14 DIAGNOSIS — L821 Other seborrheic keratosis: Secondary | ICD-10-CM | POA: Diagnosis not present

## 2020-03-14 DIAGNOSIS — Z85828 Personal history of other malignant neoplasm of skin: Secondary | ICD-10-CM | POA: Diagnosis not present

## 2020-03-26 DIAGNOSIS — G459 Transient cerebral ischemic attack, unspecified: Secondary | ICD-10-CM | POA: Diagnosis not present

## 2020-03-26 DIAGNOSIS — Z95818 Presence of other cardiac implants and grafts: Secondary | ICD-10-CM | POA: Diagnosis not present

## 2020-03-26 DIAGNOSIS — Z4509 Encounter for adjustment and management of other cardiac device: Secondary | ICD-10-CM | POA: Diagnosis not present

## 2020-04-06 DIAGNOSIS — H348322 Tributary (branch) retinal vein occlusion, left eye, stable: Secondary | ICD-10-CM | POA: Diagnosis not present

## 2020-04-06 DIAGNOSIS — H11821 Conjunctivochalasis, right eye: Secondary | ICD-10-CM | POA: Diagnosis not present

## 2020-04-06 DIAGNOSIS — H2513 Age-related nuclear cataract, bilateral: Secondary | ICD-10-CM | POA: Diagnosis not present

## 2020-04-06 DIAGNOSIS — H35352 Cystoid macular degeneration, left eye: Secondary | ICD-10-CM | POA: Diagnosis not present

## 2020-04-06 DIAGNOSIS — H0102B Squamous blepharitis left eye, upper and lower eyelids: Secondary | ICD-10-CM | POA: Diagnosis not present

## 2020-04-06 DIAGNOSIS — G43109 Migraine with aura, not intractable, without status migrainosus: Secondary | ICD-10-CM | POA: Diagnosis not present

## 2020-04-06 DIAGNOSIS — H0102A Squamous blepharitis right eye, upper and lower eyelids: Secondary | ICD-10-CM | POA: Diagnosis not present

## 2020-04-06 DIAGNOSIS — D492 Neoplasm of unspecified behavior of bone, soft tissue, and skin: Secondary | ICD-10-CM | POA: Diagnosis not present

## 2020-04-26 DIAGNOSIS — G459 Transient cerebral ischemic attack, unspecified: Secondary | ICD-10-CM | POA: Diagnosis not present

## 2020-04-26 DIAGNOSIS — Z95818 Presence of other cardiac implants and grafts: Secondary | ICD-10-CM | POA: Diagnosis not present

## 2020-04-26 DIAGNOSIS — Z4509 Encounter for adjustment and management of other cardiac device: Secondary | ICD-10-CM | POA: Diagnosis not present

## 2020-05-27 DIAGNOSIS — Z95818 Presence of other cardiac implants and grafts: Secondary | ICD-10-CM | POA: Diagnosis not present

## 2020-05-27 DIAGNOSIS — Z4509 Encounter for adjustment and management of other cardiac device: Secondary | ICD-10-CM | POA: Diagnosis not present

## 2020-05-27 DIAGNOSIS — G459 Transient cerebral ischemic attack, unspecified: Secondary | ICD-10-CM | POA: Diagnosis not present

## 2020-06-27 DIAGNOSIS — Z4509 Encounter for adjustment and management of other cardiac device: Secondary | ICD-10-CM | POA: Diagnosis not present

## 2020-06-27 DIAGNOSIS — Z95818 Presence of other cardiac implants and grafts: Secondary | ICD-10-CM | POA: Diagnosis not present

## 2020-06-27 DIAGNOSIS — G459 Transient cerebral ischemic attack, unspecified: Secondary | ICD-10-CM | POA: Diagnosis not present

## 2020-07-27 DIAGNOSIS — Z20822 Contact with and (suspected) exposure to covid-19: Secondary | ICD-10-CM | POA: Diagnosis not present

## 2020-07-28 DIAGNOSIS — Z4509 Encounter for adjustment and management of other cardiac device: Secondary | ICD-10-CM | POA: Diagnosis not present

## 2020-07-28 DIAGNOSIS — G459 Transient cerebral ischemic attack, unspecified: Secondary | ICD-10-CM | POA: Diagnosis not present

## 2020-07-28 DIAGNOSIS — Z95818 Presence of other cardiac implants and grafts: Secondary | ICD-10-CM | POA: Diagnosis not present

## 2020-08-03 DIAGNOSIS — E7800 Pure hypercholesterolemia, unspecified: Secondary | ICD-10-CM | POA: Diagnosis not present

## 2020-08-03 DIAGNOSIS — Z79899 Other long term (current) drug therapy: Secondary | ICD-10-CM | POA: Diagnosis not present

## 2020-08-03 DIAGNOSIS — Z1389 Encounter for screening for other disorder: Secondary | ICD-10-CM | POA: Diagnosis not present

## 2020-08-03 DIAGNOSIS — Z0000 Encounter for general adult medical examination without abnormal findings: Secondary | ICD-10-CM | POA: Diagnosis not present

## 2020-08-28 DIAGNOSIS — G459 Transient cerebral ischemic attack, unspecified: Secondary | ICD-10-CM | POA: Diagnosis not present

## 2020-08-28 DIAGNOSIS — Z95818 Presence of other cardiac implants and grafts: Secondary | ICD-10-CM | POA: Diagnosis not present

## 2020-08-28 DIAGNOSIS — Z4509 Encounter for adjustment and management of other cardiac device: Secondary | ICD-10-CM | POA: Diagnosis not present

## 2020-09-23 DIAGNOSIS — Z23 Encounter for immunization: Secondary | ICD-10-CM | POA: Diagnosis not present

## 2020-09-27 DIAGNOSIS — G459 Transient cerebral ischemic attack, unspecified: Secondary | ICD-10-CM | POA: Diagnosis not present

## 2020-09-27 DIAGNOSIS — Z95818 Presence of other cardiac implants and grafts: Secondary | ICD-10-CM | POA: Diagnosis not present

## 2020-09-27 DIAGNOSIS — Z4509 Encounter for adjustment and management of other cardiac device: Secondary | ICD-10-CM | POA: Diagnosis not present

## 2020-10-17 ENCOUNTER — Ambulatory Visit: Payer: Self-pay

## 2020-10-17 ENCOUNTER — Ambulatory Visit: Payer: Medicare Other | Admitting: Family Medicine

## 2020-10-17 VITALS — Ht 68.0 in | Wt 150.0 lb

## 2020-10-17 DIAGNOSIS — M25532 Pain in left wrist: Secondary | ICD-10-CM

## 2020-10-17 DIAGNOSIS — S63502A Unspecified sprain of left wrist, initial encounter: Secondary | ICD-10-CM | POA: Diagnosis not present

## 2020-10-17 MED ORDER — PREDNISONE 5 MG PO TABS: ORAL_TABLET | ORAL | 0 refills | Status: DC

## 2020-10-17 NOTE — Assessment & Plan Note (Signed)
Pain has been ongoing since 9/22.  Having significant inflammatory changes around the wrist.  No specific injury but was carrying something heavy that could have initiated the pain.  No history of gout. -Counseled on home exercise therapy and supportive care. -Prednisone. -Could consider injection or physical therapy.

## 2020-10-17 NOTE — Progress Notes (Signed)
Joseph Pitts - 75 y.o. male MRN [id number]  Date of birth: 1945-10-22  SUBJECTIVE:  Including CC & ROS.  No chief complaint on file.   Joseph Pitts is a 75 y.o. male that is presenting with acute left wrist pain.  Pain is ongoing since last Thursday.  Having swelling over the volar and ulnar aspect of the wrist.  No history of similar pain.  Has tried a brace.   Review of Systems See HPI   HISTORY: Past Medical, Surgical, Social, and Family History Reviewed & Updated per EMR.   Pertinent Historical Findings include:  Past Medical History:  Diagnosis Date   Cancer ([place])    basal cell on nose removed   Diverticulosis    Encounter for loop recorder check 08/04/2018   Hyperlipidemia    Loop recorder in situ: Biotroik 06/20/18 for cryptogenic stroke.  06/20/2018   Scheduled Remote loop recorder check 07/30/2018: There were 0 symptomatic patient activations recorded. No mode switches. NSR. No A. Fib.   TIA (transient ischemic attack) 04/08/2018   Tubular adenoma of colon 2016   Vascular occlusion    retinal vein occlusion    Past Surgical History:  Procedure Laterality Date   COLONOSCOPY  04/30/2014   LOOP RECORDER IMPLANT  06/20/2018   Biotronik Loop Recorder. Serial # [id number]  for cryptogenic stroke    POLYPECTOMY     TONSILLECTOMY      Family History  Problem Relation Age of Onset   Heart disease Mother    Prostate cancer Father    Arthritis Sister    Colon cancer Neg Hx    Esophageal cancer Neg Hx    Rectal cancer Neg Hx    Stomach cancer Neg Hx    Colon polyps Neg Hx     Social History   Socioeconomic History   Marital status: Married    Spouse name: Not on file   Number of children: 2   Years of education: Not on file   Highest education level: Not on file  Occupational History   Occupation: retired  Tobacco Use   Smoking status: Former    Packs/day: 1.00    Years: 4.00    Pack years: 4.00    Types: Cigarettes    Quit date: 1969    Years  since quitting: 53.7   Smokeless tobacco: Never  Vaping Use   Vaping Use: Never used  Substance and Sexual Activity   Alcohol use: Yes    Alcohol/week: 3.0 standard drinks    Types: 3 Cans of beer per week    Comment: Weekly   Drug use: No   Sexual activity: Not on file  Other Topics Concern   Not on file  Social History Narrative   Not on file   Social Determinants of Health   Financial Resource Strain: Not on file  Food Insecurity: Not on file  Transportation Needs: Not on file  Physical Activity: Not on file  Stress: Not on file  Social Connections: Not on file  Intimate Partner Violence: Not on file     PHYSICAL EXAM:  VS: Ht 5\' 8"  (1.727 m)   Wt 150 lb (68 kg)   BMI 22.81 kg/m  Physical Exam Gen: NAD, alert, cooperative with exam, well-appearing   Limited ultrasound: Left wrist:  Normal-appearing medial nerve in the carpal tunnel. Hypoechoic change and increased hyperemia over the ulnar TFCC and at the insertion of the ECU.   Summary: Inflammatory degenerative changes over the ulnar aspect  of the wrist.  Ultrasound and interpretation by Clearance Coots, MD    ASSESSMENT & PLAN:   Wrist sprain, left, initial encounter Pain has been ongoing since 9/22.  Having significant inflammatory changes around the wrist.  No specific injury but was carrying something heavy that could have initiated the pain.  No history of gout. -Counseled on home exercise therapy and supportive care. -Prednisone. -Could consider injection or physical therapy.

## 2020-10-17 NOTE — Patient Instructions (Signed)
Nice to meet you Please use ice as needed  You can use the brace if it helps  Please try the exercises when the pain improves   Please send me a message in MyChart with any questions or updates.  Please see me back in 3 weeks.   --Dr. Raeford Razor

## 2020-10-19 ENCOUNTER — Ambulatory Visit: Payer: No Typology Code available for payment source | Admitting: Family Medicine

## 2020-10-19 ENCOUNTER — Ambulatory Visit (INDEPENDENT_AMBULATORY_CARE_PROVIDER_SITE_OTHER): Payer: Medicare Other | Admitting: Family Medicine

## 2020-10-19 VITALS — Ht 68.0 in | Wt 150.0 lb

## 2020-10-19 DIAGNOSIS — G5622 Lesion of ulnar nerve, left upper limb: Secondary | ICD-10-CM | POA: Diagnosis not present

## 2020-10-19 MED ORDER — GABAPENTIN 300 MG PO CAPS: 300.0000 mg | ORAL_CAPSULE | Freq: Two times a day (BID) | ORAL | 0 refills | Status: DC

## 2020-10-19 NOTE — Assessment & Plan Note (Signed)
Given the nature of his pain and the distribution of his pain being consistent with the ulnar nerve, suspect that the pain that he is experiencing may be due to ulnar nerve irritation, which could be from generalized swelling from a wrist sprain.  We will continue the prednisone, advised to no longer take ibuprofen while also taking prednisone.  We will treat him with gabapentin for more neuropathic pain.  He can continue the ice splint as needed.  We can have him follow-up in 1 week if he is not having improvement in pain, otherwise in 1 month.  If he needs further support, could consider an ulnar gutter splint versus a referral to hand therapy for a custom splint.

## 2020-10-19 NOTE — Patient Instructions (Signed)
Thank you for coming to see me today. It was a pleasure. Today we talked about:   You can continue the prednisone to decrease inflammation, but in the short term I will prescribe gabapentin to see if this helps with what seems to be nerve related pain.  You can start with this nightly and increase over the next few days to twice a day if it doesn't make you too tired.  Don't take ibuprofen with the prednisone.  You can continue icing and wearing the brace.  Please follow-up with [place] in 1 week if not improving, otherwise in 1 month.  If you have any questions or concerns, please do not hesitate to call the office at [phone].  Best,   [place] Constable, DO [place]

## 2020-10-19 NOTE — Progress Notes (Signed)
   Joseph Pitts is a 75 y.o. male who presents to [facility] today for the following:  Left Wrist Pain Started 6 days ago after carrying heavy things at church No particular injury Had some aching, but off and on over the weekend felt okay 2 days ago was having worsening of pain, so made appointment to see Dr. Raeford Pitts Had [place] that showed degenerative changes at [facility] and generalized edema  Given course of prednisone, but reports that this has not provided improvement in his pain Describes it as aching, burning pain that goes from his ulnar, palmar wrist to left 4th and 5th digits Feels sore as well Can't reproduce the pain with palpation Has been taking tylenol and ibuprofen as well for the pain He is right handed  PMH reviewed.  ROS as above. Medications reviewed.  Exam:  Ht 5\' 8"  (1.727 m)   Wt 150 lb (68 kg)   BMI 22.81 kg/m  Gen: Well NAD MSK:  Left Wrist/Hand  Inspection: There is some generalized swelling on the volar aspect of the left wrist, otherwise no overlying skin changes, erythema, ecchymosis. Palpation: no TTP b/l ROM: Full ROM of the digits and wrist b/l. Fully able to extend and flex finger all fingers, no triggering palpated. Strength: 5/5 strength in the forearm, wrist and interosseus muscles b/l Neurovascular: NV intact b/l Special tests: He has a negative Tinel's at the carpal tunnel negative Tinel's at the cubital tunnel on the left.   Limited US of Left Wrist Patient's TFCC visualized with some mild degenerative changes noted.  There is not a significant amount of generalized edema surrounding his volar flexor tendons or his ECU that was visualized in both long and short axis.  The location of his pain and distribution is consistent with the location and distribution of his ulnar nerve.  There is no significant abnormality noted to the ulnar nerve on ultrasound. Impression: Degenerative changes at [facility] with essentially normal wrist ultrasound.  No results  found.   Assessment and Plan: 1) Neuropathy of left ulnar nerve at wrist Given the nature of his pain and the distribution of his pain being consistent with the ulnar nerve, suspect that the pain that he is experiencing may be due to ulnar nerve irritation, which could be from generalized swelling from a wrist sprain.  We will continue the prednisone, advised to no longer take ibuprofen while also taking prednisone.  We will treat him with gabapentin for more neuropathic pain.  He can continue the ice splint as needed.  We can have him follow-up in 1 week if he is not having improvement in pain, otherwise in 1 month.  If he needs further support, could consider an ulnar gutter splint versus a referral to hand therapy for a custom splint.   Joseph Pitts, D.O.  PGY-4 [facility]  10/19/2020 3:19 PM  Addendum:  Patient seen in the office by fellow.  Her history, exam, plan of care were precepted with me.  Joseph Lemon MD Joseph Pitts

## 2020-10-24 ENCOUNTER — Encounter: Payer: Self-pay | Admitting: Family Medicine

## 2020-10-28 DIAGNOSIS — Z4509 Encounter for adjustment and management of other cardiac device: Secondary | ICD-10-CM | POA: Diagnosis not present

## 2020-10-28 DIAGNOSIS — G459 Transient cerebral ischemic attack, unspecified: Secondary | ICD-10-CM | POA: Diagnosis not present

## 2020-10-28 DIAGNOSIS — Z95818 Presence of other cardiac implants and grafts: Secondary | ICD-10-CM | POA: Diagnosis not present

## 2020-10-31 ENCOUNTER — Encounter: Payer: Self-pay | Admitting: Family Medicine

## 2020-11-02 ENCOUNTER — Other Ambulatory Visit: Payer: Self-pay | Admitting: Family Medicine

## 2020-11-02 DIAGNOSIS — G5622 Lesion of ulnar nerve, left upper limb: Secondary | ICD-10-CM

## 2020-11-02 MED ORDER — GABAPENTIN 300 MG PO CAPS: 300.0000 mg | ORAL_CAPSULE | Freq: Two times a day (BID) | ORAL | 0 refills | Status: DC

## 2020-11-28 DIAGNOSIS — G459 Transient cerebral ischemic attack, unspecified: Secondary | ICD-10-CM | POA: Diagnosis not present

## 2020-11-28 DIAGNOSIS — Z4509 Encounter for adjustment and management of other cardiac device: Secondary | ICD-10-CM | POA: Diagnosis not present

## 2020-11-28 DIAGNOSIS — Z95818 Presence of other cardiac implants and grafts: Secondary | ICD-10-CM | POA: Diagnosis not present

## 2020-12-01 ENCOUNTER — Ambulatory Visit (INDEPENDENT_AMBULATORY_CARE_PROVIDER_SITE_OTHER): Payer: Medicare Other | Admitting: Sports Medicine

## 2020-12-01 VITALS — BP 124/72 | Ht 68.0 in | Wt 150.0 lb

## 2020-12-01 DIAGNOSIS — M7541 Impingement syndrome of right shoulder: Secondary | ICD-10-CM | POA: Diagnosis not present

## 2020-12-01 DIAGNOSIS — M25511 Pain in right shoulder: Secondary | ICD-10-CM | POA: Diagnosis not present
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
## 2020-12-07 DIAGNOSIS — N401 Enlarged prostate with lower urinary tract symptoms: Secondary | ICD-10-CM | POA: Diagnosis not present

## 2020-12-07 DIAGNOSIS — N403 Nodular prostate with lower urinary tract symptoms: Secondary | ICD-10-CM | POA: Diagnosis not present

## 2020-12-07 DIAGNOSIS — R3912 Poor urinary stream: Secondary | ICD-10-CM | POA: Diagnosis not present

## 2020-12-09 ENCOUNTER — Other Ambulatory Visit: Payer: Self-pay | Admitting: Urology

## 2020-12-09 ENCOUNTER — Ambulatory Visit
Admission: RE | Admit: 2020-12-09 | Discharge: 2020-12-09 | Disposition: A | Discharge status: Home / Self Care | Payer: No Typology Code available for payment source | Source: Ambulatory Visit | Attending: Sports Medicine | Admitting: Sports Medicine

## 2020-12-09 DIAGNOSIS — M25511 Pain in right shoulder: Secondary | ICD-10-CM

## 2020-12-09 DIAGNOSIS — N403 Nodular prostate with lower urinary tract symptoms: Secondary | ICD-10-CM

## 2020-12-22 ENCOUNTER — Ambulatory Visit (INDEPENDENT_AMBULATORY_CARE_PROVIDER_SITE_OTHER): Payer: Medicare Other | Admitting: Sports Medicine

## 2020-12-22 ENCOUNTER — Ambulatory Visit: Payer: Self-pay

## 2020-12-22 DIAGNOSIS — M7541 Impingement syndrome of right shoulder: Secondary | ICD-10-CM

## 2020-12-22 DIAGNOSIS — M25511 Pain in right shoulder: Secondary | ICD-10-CM

## 2020-12-22 MED ORDER — METHYLPREDNISOLONE ACETATE 40 MG/ML IJ SUSP
40.0000 mg | Freq: Once | INTRAMUSCULAR | Status: AC
  Administered 2020-12-22: 40 mg via INTRA_ARTICULAR

## 2020-12-22 NOTE — Progress Notes (Signed)
   Subjective:    Patient ID: Joseph Pitts, male    DOB: 01-31-1945, 75 y.o.   MRN: [id number]  HPI  Comes in today for follow-up on right shoulder pain.  Pain is improved by only about 20%.  He has been diligent with his home exercises but is getting some pain with abduction and external rotation exercises.  He still also endorses nighttime pain.  Recent x-rays of his right shoulder showed mild degenerative changes at the acromioclavicular joint and minimal changes in the glenohumeral joint.  He continues to localizes pain to the lateral shoulder.    Review of Systems As above    Objective:   Physical Exam  Well-developed, well-nourished.  No acute distress  Right shoulder: Full active and passive range of motion.  Positive painful arc.  Positive empty can, positive Hawkins.  He does have slight amount of weakness with resisted supraspinatus and external rotation on the right compared to the left.  No atrophy.  Neurovascularly intact distally.  Complete MSK ultrasound of the right shoulder: Biceps tendon was well visualized in both long and short axis.  There is fluid surrounding the biceps tendon proximally.  Subscapularis was well visualized in the long axis without abnormality.  Supraspinatus was visualized in both long and short axis.  There are cortical changes in the humeral head as well as an area of hypoechoic change in the anterior fibers of the supraspinatus consistent with a probable tear here.  There has also been some loss of the normally convex tendon structure.  Infraspinatus tendon is well visualized in long axis without obvious abnormality.  Acromioclavicular joint shows mild degenerative changes with a positive mushroom sign.  Posterior glenohumeral joint appears unremarkable. These findings are consistent with a probable full-thickness supraspinatus tendon tear along the anterior leading edge.      Assessment & Plan:   Right shoulder pain likely secondary to rotator  cuff tear  I discussed options for treatment.  Patient would like to avoid surgery if possible and I agree.  He has excellent range of motion and nearly full strength.  I recommended that we try a subacromial cortisone injection and couple that with formal physical therapy.  He will follow-up with me again in 4 to 5 weeks.  If he continues to have pain then we will consider merits of further diagnostic imaging at that time.  He will call me with questions or concerns in the interim.  Consent obtained and verified. Time-out conducted. Noted no overlying erythema, induration, or other signs of local infection. Skin prepped in a sterile fashion. Topical analgesic spray: Ethyl chloride. Joint: Right shoulder subacromial space Needle: 25-gauge 1.5 inch Completed without difficulty. Meds: 3 cc 1% Xylocaine, 1 cc (40 mg) Depo-Medrol  Advised to call if fevers/chills, erythema, induration, drainage, or persistent bleeding.   This note was dictated using Dragon naturally speaking software and may contain errors in syntax, spelling, or content which have not been identified prior to signing this note.

## 2020-12-29 DIAGNOSIS — Z20822 Contact with and (suspected) exposure to covid-19: Secondary | ICD-10-CM | POA: Diagnosis not present

## 2021-01-11 ENCOUNTER — Ambulatory Visit: Payer: Medicare Other | Attending: Sports Medicine

## 2021-01-11 ENCOUNTER — Other Ambulatory Visit: Payer: Self-pay

## 2021-01-11 DIAGNOSIS — M7541 Impingement syndrome of right shoulder: Secondary | ICD-10-CM | POA: Insufficient documentation

## 2021-01-11 DIAGNOSIS — M25511 Pain in right shoulder: Secondary | ICD-10-CM | POA: Insufficient documentation

## 2021-01-11 NOTE — Therapy (Signed)
OUTPATIENT PHYSICAL THERAPY SHOULDER EVALUATION   Patient Name: Joseph Pitts MRN: [id number] DOB:Aug 17, 1945, 75 y.o., male Today's Date: 01/12/2021   PT End of Session - 01/12/21 1321     Visit Number 1    Number of Visits 6    Date for PT Re-Evaluation 02/09/21    Authorization Type MCR/MCD    Authorization Time Period 01/11/21-02/09/21    Progress Note Due on Visit 8    PT Start Time 1400    PT Stop Time 1445    PT Time Calculation (min) 45 min    Activity Tolerance Patient tolerated treatment well    Behavior During Therapy [facility] for tasks assessed/performed             Past Medical History:  Diagnosis Date   Cancer ([place])    basal cell on nose removed   Diverticulosis    Encounter for loop recorder check 08/04/2018   Hyperlipidemia    Loop recorder in situ: Biotroik 06/20/18 for cryptogenic stroke.  06/20/2018   Scheduled Remote loop recorder check 07/30/2018: There were 0 symptomatic patient activations recorded. No mode switches. NSR. No A. Fib.   TIA (transient ischemic attack) 04/08/2018   Tubular adenoma of colon 2016   Vascular occlusion    retinal vein occlusion   Past Surgical History:  Procedure Laterality Date   COLONOSCOPY  04/30/2014   LOOP RECORDER IMPLANT  06/20/2018   Biotronik Loop Recorder. Serial # [id number]  for cryptogenic stroke    POLYPECTOMY     TONSILLECTOMY     Patient Active Problem List   Diagnosis Date Noted   Neuropathy of left ulnar nerve at wrist 10/19/2020   Wrist sprain, left, initial encounter 10/17/2020   Encounter for loop recorder check 08/04/2018   Loop recorder in situ: Biotroik 06/20/18 for cryptogenic stroke.  06/20/2018   History of recurrent TIAs 06/18/2018   Hypercholesteremia 06/18/2018   Essential hypertension 04/09/2018   History of tobacco use 04/09/2018   TIA (transient ischemic attack) 04/08/2018   Degeneration disease of medial meniscus 01/26/2016   Strain of acromioclavicular joint 06/29/2015   Left  shoulder pain 10/29/2013   Inguinal hernia 02/03/2013   First degree heart block 02/03/2013   Retinal hemorrhage of left eye 02/03/2013   Dyslipidemia 07/22/2012   Erectile dysfunction 07/22/2012    PCP: Lajean Manes, MD  REFERRING PROVIDER: Thurman Coyer, DO  REFERRING DIAG: Rotator cuff impingement syndrome of right shoulder   THERAPY DIAG: Rotator cuff impingement syndrome of right shoulder   ONSET DATE: 12/22/2020   SUBJECTIVE:  SUBJECTIVE STATEMENT: Received a cortisone injection  PERTINENT HISTORY: Relates a 2 month history of R shoulder pain limiting sleep positions  PAIN:  Are you having pain? Yes VAS scale: 3/10 Pain location: R shoulder Pain orientation: Right  PAIN TYPE: aching and throbbing Pain description: intermittent  Aggravating factors: OH reaching Relieving factors: rest  PRECAUTIONS: None  WEIGHT BEARING RESTRICTIONS No  FALLS:  Has patient fallen in last 6 months? No   LIVING ENVIRONMENT: Lives with: lives with their family Lives in: House/apartment  OCCUPATION: retired  PLOF: Independent  PATIENT GOALS manage my pain  OBJECTIVE:   DIAGNOSTIC FINDINGS:  Right shoulder: Full active and passive range of motion.  Positive painful arc.  Positive empty can, positive Hawkins.  He does have slight amount of weakness with resisted supraspinatus and external rotation on the right compared to the left.  No atrophy.  Neurovascularly intact distally.  Complete MSK ultrasound of the right shoulder: Biceps tendon was well visualized in both long and short axis.  There is fluid surrounding the biceps tendon proximally.  Subscapularis was well visualized in the long axis without abnormality.  Supraspinatus was visualized in both long and short axis.  There are cortical  changes in the humeral head as well as an area of hypoechoic change in the anterior fibers of the supraspinatus consistent with a probable tear here.  There has also been some loss of the normally convex tendon structure.  Infraspinatus tendon is well visualized in long axis without obvious abnormality.  Acromioclavicular joint shows mild degenerative changes with a positive mushroom sign.  Posterior glenohumeral joint appears unremarkable. These findings are consistent with a probable full-thickness supraspinatus tendon tear along the anterior leading edge.  PATIENT SURVEYS:   FOTO: shoulder 59  COGNITION:  Overall cognitive status: Within functional limits for tasks assessed     SENSATION:  Light touch: Appears intact    POSTURE: Forward head rounded shoulders  PALPATION: TP to R infraspinatus   UPPER EXTREMITY AROM/PROM:  A/PROM Right 01/12/2021 Left 01/12/2021  Shoulder flexion 160/170   Shoulder extension wnl   Shoulder abduction 160/175   Shoulder adduction    Shoulder internal rotation Wnl/70   Shoulder external rotation Wnl/70   Elbow flexion    Elbow extension    Wrist flexion    Wrist extension    Wrist ulnar deviation    Wrist radial deviation    Wrist pronation    Wrist supination    (Blank rows = not tested, L side WNL)  UPPER EXTREMITY MMT:  MMT Right 01/12/2021 Left 01/12/2021  Shoulder flexion 4   Shoulder extension 4   Shoulder abduction 4                                                   scaption 4-   (Blank rows = not tested,L side WNL )  SHOULDER SPECIAL TESTS:  Impingement tests: Neer impingement test: negative, Hawkins/Kennedy impingement test: negative, and Painful arc test: negative   JOINT MOBILITY TESTING:  unremarkable  PALPATION:  TP to infraspinatus  POSTURE:  Forward head and rounded shoulders   TODAY'S TREATMENT:  Eval and HEP   PATIENT EDUCATION: Education details: Discussed eval findings, rehab rationale  and POC and patient is in agreement Person educated: Patient Education method: Explanation, Demonstration, and Handouts Education comprehension: verbalized understanding, returned demonstration,  and needs further education   HOME EXERCISE PROGRAM: Access Code: [email or web address] URL: [email or web address] Date: 01/11/2021 Prepared by: Sharlynn Oliphant  Exercises Sidelying Shoulder Abduction Palm Forward - 2 x daily - 7 x weekly - 1 sets - 30 reps Standing Shoulder Scaption - 2 x daily - 7 x weekly - 1 sets - 30 reps   ASSESSMENT:  CLINICAL IMPRESSION: Patient is a 75 y.o. male who was seen today for physical therapy evaluation and treatment for R shoulder pain.  He is 3 weeks post subacromial injection with mild symptom relief, mild restrictions noted with AROM, mild strength deficits noted in R shoulder, especially in scaption with excessive R scapula elevation observed with AROM.Objective impairments include decreased activity tolerance, decreased endurance, decreased knowledge of condition, decreased mobility, decreased ROM, decreased strength, hypomobility, impaired UE functional use, improper body mechanics, postural dysfunction, and pain. These impairments are limiting patient from community activity, driving, and gym activities . Personal factors including Age and Fitness are also affecting patient's functional outcome. Patient will benefit from skilled PT to address above impairments and improve overall function.  REHAB POTENTIAL: Good  CLINICAL DECISION MAKING: Stable/uncomplicated  EVALUATION COMPLEXITY: Low   GOALS: Goals reviewed with patient? Yes  SHORT TERM GOALS:  STG Name Target Date Goal status  1 STGs=LTGs  01/26/2021 INITIAL                                 LONG TERM GOALS:   LTG Name Target Date Goal status  1 Patient to demonstrate independence in HEP  Baseline:[id number] 02/09/2021 INITIAL  2 Increase R shoulder strength to 4+/5 in all deficit  areas Baseline: MMT Right 01/11/2021 Left 01/11/2021  Shoulder flexion 4   Shoulder extension 4   Shoulder abduction 4                                                   scaption 4-    02/09/2021 INITIAL  3 Increase R shoulder AROM to 170d of flexion and abduction Baseline: A/PROM Right 01/11/2021 Left 01/11/2021  Shoulder flexion 160/170   Shoulder extension wnl   Shoulder abduction 160/175   Shoulder adduction    Shoulder internal rotation Wnl/70   Shoulder external rotation Wnl/70    02/09/2021 INITIAL  4 Increase FOTO score to 75 Baseline:57 02/09/2021 INITIAL  5 Restore normal scapulohumeral and scapulothoracic rhythm with AROM Baseline:R scapular elevation with flexion and abduction 02/09/2021 INITIAL             PLAN: PT FREQUENCY: 2x/week  PT DURATION: 4 weeks  PLANNED INTERVENTIONS: Therapeutic exercises, Therapeutic activity, Neuro Muscular re-education, Balance training, Gait training, Patient/Family education, Joint mobilization, and Manual therapy  PLAN FOR NEXT SESSION: Posterior shoulder strengthening, HEP update, ROM, supraspinatus re-ed   Lanice Shirts PT 01/12/2021, 1:36 PM

## 2021-01-17 ENCOUNTER — Other Ambulatory Visit: Payer: Self-pay | Admitting: Urology

## 2021-01-17 ENCOUNTER — Other Ambulatory Visit: Payer: No Typology Code available for payment source

## 2021-01-17 DIAGNOSIS — N403 Nodular prostate with lower urinary tract symptoms: Secondary | ICD-10-CM

## 2021-01-25 ENCOUNTER — Other Ambulatory Visit: Payer: Self-pay

## 2021-01-25 ENCOUNTER — Ambulatory Visit: Payer: Medicare Other | Attending: Sports Medicine

## 2021-01-25 DIAGNOSIS — M6281 Muscle weakness (generalized): Secondary | ICD-10-CM | POA: Diagnosis not present

## 2021-01-25 DIAGNOSIS — M7541 Impingement syndrome of right shoulder: Secondary | ICD-10-CM | POA: Insufficient documentation

## 2021-01-25 DIAGNOSIS — M25511 Pain in right shoulder: Secondary | ICD-10-CM | POA: Diagnosis not present

## 2021-01-25 NOTE — Therapy (Signed)
OUTPATIENT PHYSICAL THERAPY TREATMENT NOTE   Patient Name: Joseph REVARD MRN: [id number] DOB:03/25/1945, 76 y.o., male Today's Date: 01/25/2021  PCP: Lajean Manes, MD REFERRING PROVIDER: Thurman Coyer, DO   PT End of Session - 01/25/21 1705     Visit Number 2    Number of Visits 6    Date for PT Re-Evaluation 02/09/21    Authorization Type MCR/MCD    Authorization Time Period 01/11/21-02/09/21    Progress Note Due on Visit 8    PT Start Time 1700    PT Stop Time 1745    PT Time Calculation (min) 45 min    Activity Tolerance Patient tolerated treatment well    Behavior During Therapy [facility] for tasks assessed/performed             Past Medical History:  Diagnosis Date   Cancer ([place])    basal cell on nose removed   Diverticulosis    Encounter for loop recorder check 08/04/2018   Hyperlipidemia    Loop recorder in situ: Biotroik 06/20/18 for cryptogenic stroke.  06/20/2018   Scheduled Remote loop recorder check 07/30/2018: There were 0 symptomatic patient activations recorded. No mode switches. NSR. No A. Fib.   TIA (transient ischemic attack) 04/08/2018   Tubular adenoma of colon 2016   Vascular occlusion    retinal vein occlusion   Past Surgical History:  Procedure Laterality Date   COLONOSCOPY  04/30/2014   LOOP RECORDER IMPLANT  06/20/2018   Biotronik Loop Recorder. Serial # [id number]  for cryptogenic stroke    POLYPECTOMY     TONSILLECTOMY     Patient Active Problem List   Diagnosis Date Noted   Neuropathy of left ulnar nerve at wrist 10/19/2020   Wrist sprain, left, initial encounter 10/17/2020   Encounter for loop recorder check 08/04/2018   Loop recorder in situ: Biotroik 06/20/18 for cryptogenic stroke.  06/20/2018   History of recurrent TIAs 06/18/2018   Hypercholesteremia 06/18/2018   Essential hypertension 04/09/2018   History of tobacco use 04/09/2018   TIA (transient ischemic attack) 04/08/2018   Degeneration disease of medial meniscus  01/26/2016   Strain of acromioclavicular joint 06/29/2015   Left shoulder pain 10/29/2013   Inguinal hernia 02/03/2013   First degree heart block 02/03/2013   Retinal hemorrhage of left eye 02/03/2013   Dyslipidemia 07/22/2012   Erectile dysfunction 07/22/2012    REFERRING DIAG: R shoulder impingement  THERAPY DIAG:  Acute pain of right shoulder  Impingement syndrome of right shoulder  PERTINENT HISTORY: see eval  PRECAUTIONS: none  SUBJECTIVE: still notes symptoms when lying on R side  PAIN:  Are you having pain? Yes VAS scale: 2/10 Pain location: shoulder Pain orientation: Right  PAIN TYPE: aching Pain description: intermittent  Aggravating factors: R sidelying Relieving factors: position change       OBJECTIVE:    DIAGNOSTIC FINDINGS:  Right shoulder: Full active and passive range of motion.  Positive painful arc.  Positive empty can, positive Hawkins.  He does have slight amount of weakness with resisted supraspinatus and external rotation on the right compared to the left.  No atrophy.  Neurovascularly intact distally.  Complete MSK ultrasound of the right shoulder: Biceps tendon was well visualized in both long and short axis.  There is fluid surrounding the biceps tendon proximally.  Subscapularis was well visualized in the long axis without abnormality.  Supraspinatus was visualized in both long and short axis.  There are cortical changes in the humeral head as  well as an area of hypoechoic change in the anterior fibers of the supraspinatus consistent with a probable tear here.  There has also been some loss of the normally convex tendon structure.  Infraspinatus tendon is well visualized in long axis without obvious abnormality.  Acromioclavicular joint shows mild degenerative changes with a positive mushroom sign.  Posterior glenohumeral joint appears unremarkable. These findings are consistent with a probable full-thickness supraspinatus tendon tear along the  anterior leading edge.   PATIENT SURVEYS:            FOTO: shoulder 42   COGNITION:          Overall cognitive status: Within functional limits for tasks assessed                               SENSATION:          Light touch: Appears intact             POSTURE: Forward head rounded shoulders   PALPATION: TP to R infraspinatus    UPPER EXTREMITY AROM/PROM:   A/PROM Right 01/12/2021 Left 01/12/2021  Shoulder flexion 160/170    Shoulder extension wnl    Shoulder abduction 160/175    Shoulder adduction      Shoulder internal rotation Wnl/70    Shoulder external rotation Wnl/70    Elbow flexion      Elbow extension      Wrist flexion      Wrist extension      Wrist ulnar deviation      Wrist radial deviation      Wrist pronation      Wrist supination      (Blank rows = not tested, L side WNL)   UPPER EXTREMITY MMT:   MMT Right 01/12/2021 Left 01/12/2021  Shoulder flexion 4    Shoulder extension 4    Shoulder abduction 4                                                    scaption 4-    (Blank rows = not tested,L side WNL )   SHOULDER SPECIAL TESTS:           Impingement tests: Neer impingement test: negative, Hawkins/Kennedy impingement test: negative, and Painful arc test: negative            JOINT MOBILITY TESTING:  unremarkable   PALPATION:  TP to infraspinatus   POSTURE:  Forward head and rounded shoulders             [facility] Adult PT Treatment:                                                DATE: 01/25/21 Therapeutic Exercise: UBE L1 3/3 min Supine shoulder flexion, unilateral, yellow band 15x per side Supine hor abd 30x yellow band followed by unilateral 15x per side Rhythmic stabilization at 60/90/120d flexion, 60s hold Sidelie abduction 1# 30x Sidelie ER 1# 30x Manual Therapy: TP release to R infraspinatus followed by prolonged IR stretch x2 min    TODAY'S TREATMENT:  Eval and HEP     PATIENT EDUCATION: Education details:  Discussed  eval findings, rehab rationale and POC and patient is in agreement Person educated: Patient Education method: Explanation, Demonstration, and Handouts Education comprehension: verbalized understanding, returned demonstration, and needs further education     HOME EXERCISE PROGRAM: Access Code: [email or web address] URL: [email or web address] Date: 01/11/2021 Prepared by: Sharlynn Oliphant   Exercises Sidelying Shoulder Abduction Palm Forward - 2 x daily - 7 x weekly - 1 sets - 30 reps Standing Shoulder Scaption - 2 x daily - 7 x weekly - 1 sets - 30 reps     ASSESSMENT:   CLINICAL IMPRESSION: No change in pain to date.  Still symptomatic when sleeping on R side. TP still noted in R infraspinatus.  Began UBE as well as TP release which improved IR from 60d initially to 80d post manual intervention.   REHAB POTENTIAL: Good   CLINICAL DECISION MAKING: Stable/uncomplicated   EVALUATION COMPLEXITY: Low     GOALS: Goals reviewed with patient? Yes   SHORT TERM GOALS:   STG Name Target Date Goal status  1 STGs=LTGs   01/26/2021 INITIAL                                                          LONG TERM GOALS:    LTG Name Target Date Goal status  1 Patient to demonstrate independence in HEP  Baseline:[id number] 02/09/2021 INITIAL  2 Increase R shoulder strength to 4+/5 in all deficit areas Baseline: MMT Right 01/11/2021 Left 01/11/2021  Shoulder flexion 4    Shoulder extension 4    Shoulder abduction 4                                                                                        scaption 4-     02/09/2021 INITIAL  3 Increase R shoulder AROM to 170d of flexion and abduction Baseline: A/PROM Right 01/11/2021 Left 01/11/2021  Shoulder flexion 160/170    Shoulder extension wnl    Shoulder abduction 160/175    Shoulder adduction      Shoulder internal rotation Wnl/70    Shoulder external rotation Wnl/70     02/09/2021 INITIAL  4 Increase  FOTO score to 75 Baseline:57 02/09/2021 INITIAL  5 Restore normal scapulohumeral and scapulothoracic rhythm with AROM Baseline:R scapular elevation with flexion and abduction 02/09/2021 INITIAL                      PLAN: PT FREQUENCY: 2x/week   PT DURATION: 4 weeks   PLANNED INTERVENTIONS: Therapeutic exercises, Therapeutic activity, Neuro Muscular re-education, Balance training, Gait training, Patient/Family education, Joint mobilization, and Manual therapy   PLAN FOR NEXT SESSION: Posterior shoulder strengthening, HEP update, ROM, supraspinatus re-ed, TP techniques for R infraspinatus    Lanice Shirts 01/25/2021, 5:20 PM

## 2021-01-26 ENCOUNTER — Ambulatory Visit: Payer: No Typology Code available for payment source | Admitting: Sports Medicine

## 2021-01-30 ENCOUNTER — Ambulatory Visit: Payer: Medicare Other

## 2021-01-30 ENCOUNTER — Other Ambulatory Visit: Payer: Self-pay

## 2021-01-30 DIAGNOSIS — M7541 Impingement syndrome of right shoulder: Secondary | ICD-10-CM

## 2021-01-30 DIAGNOSIS — M6281 Muscle weakness (generalized): Secondary | ICD-10-CM | POA: Diagnosis not present

## 2021-01-30 DIAGNOSIS — M25511 Pain in right shoulder: Secondary | ICD-10-CM

## 2021-01-30 NOTE — Therapy (Signed)
OUTPATIENT PHYSICAL THERAPY TREATMENT NOTE   Patient Name: Joseph Pitts MRN: [id number] DOB:09-Sep-1945, 76 y.o., male Today's Date: 01/30/2021  PCP: Lajean Manes, MD REFERRING PROVIDER: Thurman Coyer, DO   PT End of Session - 01/30/21 1359     Visit Number 3    Number of Visits 6    Date for PT Re-Evaluation 02/09/21    Authorization Type MCR/MCD    Authorization Time Period 01/11/21-02/09/21    Progress Note Due on Visit 8    PT Start Time 1400    PT Stop Time 1445    PT Time Calculation (min) 45 min    Activity Tolerance Patient tolerated treatment well    Behavior During Therapy [facility] for tasks assessed/performed             Past Medical History:  Diagnosis Date   Cancer ([place])    basal cell on nose removed   Diverticulosis    Encounter for loop recorder check 08/04/2018   Hyperlipidemia    Loop recorder in situ: Biotroik 06/20/18 for cryptogenic stroke.  06/20/2018   Scheduled Remote loop recorder check 07/30/2018: There were 0 symptomatic patient activations recorded. No mode switches. NSR. No A. Fib.   TIA (transient ischemic attack) 04/08/2018   Tubular adenoma of colon 2016   Vascular occlusion    retinal vein occlusion   Past Surgical History:  Procedure Laterality Date   COLONOSCOPY  04/30/2014   LOOP RECORDER IMPLANT  06/20/2018   Biotronik Loop Recorder. Serial # [id number]  for cryptogenic stroke    POLYPECTOMY     TONSILLECTOMY     Patient Active Problem List   Diagnosis Date Noted   Neuropathy of left ulnar nerve at wrist 10/19/2020   Wrist sprain, left, initial encounter 10/17/2020   Encounter for loop recorder check 08/04/2018   Loop recorder in situ: Biotroik 06/20/18 for cryptogenic stroke.  06/20/2018   History of recurrent TIAs 06/18/2018   Hypercholesteremia 06/18/2018   Essential hypertension 04/09/2018   History of tobacco use 04/09/2018   TIA (transient ischemic attack) 04/08/2018   Degeneration disease of medial meniscus  01/26/2016   Strain of acromioclavicular joint 06/29/2015   Left shoulder pain 10/29/2013   Inguinal hernia 02/03/2013   First degree heart block 02/03/2013   Retinal hemorrhage of left eye 02/03/2013   Dyslipidemia 07/22/2012   Erectile dysfunction 07/22/2012    REFERRING DIAG: R shoulder impingement  THERAPY DIAG:  Acute pain of right shoulder  Impingement syndrome of right shoulder  Muscle weakness (generalized)  PERTINENT HISTORY: see eval  PRECAUTIONS: none  SUBJECTIVE: Less discomfort noted when lying on R side, has reduced HEP to only those assigned by PT, pausing MD assigned tasks.  PAIN:  Are you having pain? Yes VAS scale: 2/10 Pain location: shoulder Pain orientation: Right  PAIN TYPE: aching Pain description: intermittent  Aggravating factors: R sidelying Relieving factors: position change       OBJECTIVE:    DIAGNOSTIC FINDINGS:  Right shoulder: Full active and passive range of motion.  Positive painful arc.  Positive empty can, positive Hawkins.  He does have slight amount of weakness with resisted supraspinatus and external rotation on the right compared to the left.  No atrophy.  Neurovascularly intact distally.  Complete MSK ultrasound of the right shoulder: Biceps tendon was well visualized in both long and short axis.  There is fluid surrounding the biceps tendon proximally.  Subscapularis was well visualized in the long axis without abnormality.  Supraspinatus was  visualized in both long and short axis.  There are cortical changes in the humeral head as well as an area of hypoechoic change in the anterior fibers of the supraspinatus consistent with a probable tear here.  There has also been some loss of the normally convex tendon structure.  Infraspinatus tendon is well visualized in long axis without obvious abnormality.  Acromioclavicular joint shows mild degenerative changes with a positive mushroom sign.  Posterior glenohumeral joint appears  unremarkable. These findings are consistent with a probable full-thickness supraspinatus tendon tear along the anterior leading edge.   PATIENT SURVEYS:            FOTO: shoulder 44   COGNITION:          Overall cognitive status: Within functional limits for tasks assessed                               SENSATION:          Light touch: Appears intact             POSTURE: Forward head rounded shoulders   PALPATION: TP to R infraspinatus    UPPER EXTREMITY AROM/PROM:   A/PROM Right 01/12/2021 Left 01/12/2021  Shoulder flexion 160/170    Shoulder extension wnl    Shoulder abduction 160/175    Shoulder adduction      Shoulder internal rotation Wnl/70    Shoulder external rotation Wnl/70    Elbow flexion      Elbow extension      Wrist flexion      Wrist extension      Wrist ulnar deviation      Wrist radial deviation      Wrist pronation      Wrist supination      (Blank rows = not tested, L side WNL)   UPPER EXTREMITY MMT:   MMT Right 01/12/2021 Left 01/12/2021  Shoulder flexion 4    Shoulder extension 4    Shoulder abduction 4                                                    scaption 4-    (Blank rows = not tested,L side WNL )   SHOULDER SPECIAL TESTS:           Impingement tests: Neer impingement test: negative, Hawkins/Kennedy impingement test: negative, and Painful arc test: negative            JOINT MOBILITY TESTING:  unremarkable   PALPATION:  TP to infraspinatus   POSTURE:  Forward head and rounded shoulders      [facility] Adult PT Treatment:                                                DATE: 01/30/21 Therapeutic Exercise: UBE L2 3/3 min Supine shoulder flexion, unilateral, red band 15x per side Supine hor abd 30x red band followed by unilateral 15x per side Supine flexion 2# 30x Supine press and protract 2# 30x Sidelie abduction 1# 30x Sidelie ER 1# 30x Seated scaption, 2# 30x Manual Therapy: PROM, GH mobs inferior and  posterior TP release R infraspinatus D1 F/E 30x  with PT assist      [facility] Adult PT Treatment:                                                DATE: 01/25/21 Therapeutic Exercise: UBE L1 3/3 min Supine shoulder flexion, unilateral, yellow band 15x per side Supine hor abd 30x yellow band followed by unilateral 15x per side Rhythmic stabilization at 60/90/120d flexion, 60s hold Sidelie abduction 1# 30x Sidelie ER 1# 30x Manual Therapy: TP release to R infraspinatus followed by prolonged IR stretch x2 min    TODAY'S TREATMENT:  Eval and HEP     PATIENT EDUCATION: Education details: Discussed eval findings, rehab rationale and POC and patient is in agreement Person educated: Patient Education method: Explanation, Demonstration, and Handouts Education comprehension: verbalized understanding, returned demonstration, and needs further education     HOME EXERCISE PROGRAM: Access Code: [email or web address] URL: [email or web address] Date: 01/11/2021 Prepared by: Sharlynn Oliphant   Exercises Sidelying Shoulder Abduction Palm Forward - 2 x daily - 7 x weekly - 1 sets - 30 reps Standing Shoulder Scaption - 2 x daily - 7 x weekly - 1 sets - 30 reps     ASSESSMENT:   CLINICAL IMPRESSION: Patient is reporting a decrease in symptoms when lying on his right side which is progress, progressed to weighted strengthening and continued manual stretching, has regained 85d IR, good AROM observed with exercises   REHAB POTENTIAL: Good   CLINICAL DECISION MAKING: Stable/uncomplicated   EVALUATION COMPLEXITY: Low     GOALS: Goals reviewed with patient? Yes   SHORT TERM GOALS:   STG Name Target Date Goal status  1 STGs=LTGs   01/26/2021 INITIAL                                                          LONG TERM GOALS:    LTG Name Target Date Goal status  1 Patient to demonstrate independence in HEP  Baseline:[id number] 02/09/2021 INITIAL  2 Increase R shoulder strength to 4+/5 in all  deficit areas Baseline: MMT Right 01/11/2021 Left 01/11/2021  Shoulder flexion 4    Shoulder extension 4    Shoulder abduction 4                                                                                        scaption 4-     02/09/2021 INITIAL  3 Increase R shoulder AROM to 170d of flexion and abduction Baseline: A/PROM Right 01/11/2021 Left 01/11/2021  Shoulder flexion 160/170    Shoulder extension wnl    Shoulder abduction 160/175    Shoulder adduction      Shoulder internal rotation Wnl/70    Shoulder external rotation Wnl/70     02/09/2021 INITIAL  4 Increase FOTO score to 75 Baseline:57 02/09/2021 INITIAL  5 Restore normal scapulohumeral and scapulothoracic rhythm  with AROM Baseline:R scapular elevation with flexion and abduction 02/09/2021 INITIAL                      PLAN: PT FREQUENCY: 2x/week   PT DURATION: 4 weeks   PLANNED INTERVENTIONS: Therapeutic exercises, Therapeutic activity, Neuro Muscular re-education, Balance training, Gait training, Patient/Family education, Joint mobilization, and Manual therapy   PLAN FOR NEXT SESSION: Posterior shoulder strengthening, HEP update, ROM, supraspinatus re-ed, TP techniques for R infraspinatus, add resistance as tolerated, IR stretch    Jacqulynn Cadet Shlome Baldree PT 01/30/2021, 2:00 PM

## 2021-02-01 ENCOUNTER — Other Ambulatory Visit: Payer: Self-pay

## 2021-02-01 ENCOUNTER — Ambulatory Visit: Payer: Medicare Other

## 2021-02-01 DIAGNOSIS — M25511 Pain in right shoulder: Secondary | ICD-10-CM

## 2021-02-01 DIAGNOSIS — M6281 Muscle weakness (generalized): Secondary | ICD-10-CM

## 2021-02-01 DIAGNOSIS — M7541 Impingement syndrome of right shoulder: Secondary | ICD-10-CM

## 2021-02-01 NOTE — Therapy (Signed)
OUTPATIENT PHYSICAL THERAPY TREATMENT NOTE   Patient Name: Joseph Pitts MRN: [id number] DOB:[birth year], 76 y.o., male Today's Date: 02/01/2021  PCP: Lajean Manes, MD REFERRING PROVIDER: Thurman Coyer, DO   PT End of Session - 02/01/21 1044     Visit Number 4    Number of Visits 6    Date for PT Re-Evaluation 02/09/21    Authorization Type MCR/MCD    Authorization Time Period 01/11/21-02/09/21    Progress Note Due on Visit 8    PT Start Time 1045    PT Stop Time 1130    PT Time Calculation (min) 45 min    Activity Tolerance Patient tolerated treatment well    Behavior During Therapy [facility] for tasks assessed/performed             Past Medical History:  Diagnosis Date   Cancer ([place])    basal cell on nose removed   Diverticulosis    Encounter for loop recorder check 08/04/2018   Hyperlipidemia    Loop recorder in situ: Biotroik 06/20/18 for cryptogenic stroke.  06/20/2018   Scheduled Remote loop recorder check 07/30/2018: There were 0 symptomatic patient activations recorded. No mode switches. NSR. No A. Fib.   TIA (transient ischemic attack) 04/08/2018   Tubular adenoma of colon 2016   Vascular occlusion    retinal vein occlusion   Past Surgical History:  Procedure Laterality Date   COLONOSCOPY  04/30/2014   LOOP RECORDER IMPLANT  06/20/2018   Biotronik Loop Recorder. Serial # [id number]  for cryptogenic stroke    POLYPECTOMY     TONSILLECTOMY     Patient Active Problem List   Diagnosis Date Noted   Neuropathy of left ulnar nerve at wrist 10/19/2020   Wrist sprain, left, initial encounter 10/17/2020   Encounter for loop recorder check 08/04/2018   Loop recorder in situ: Biotroik 06/20/18 for cryptogenic stroke.  06/20/2018   History of recurrent TIAs 06/18/2018   Hypercholesteremia 06/18/2018   Essential hypertension 04/09/2018   History of tobacco use 04/09/2018   TIA (transient ischemic attack) 04/08/2018   Degeneration disease of medial meniscus  01/26/2016   Strain of acromioclavicular joint 06/29/2015   Left shoulder pain 10/29/2013   Inguinal hernia 02/03/2013   First degree heart block 02/03/2013   Retinal hemorrhage of left eye 02/03/2013   Dyslipidemia 07/22/2012   Erectile dysfunction 07/22/2012    REFERRING DIAG: R shoulder impingement  THERAPY DIAG:  Acute pain of right shoulder  Impingement syndrome of right shoulder  Muscle weakness (generalized)  PERTINENT HISTORY: see eval  PRECAUTIONS: none  SUBJECTIVE: Still with posterior shoulder ache, worst with sleeping.  Still goes to gym.  Has not tried to use his thera-gun yet  PAIN:  Are you having pain? No. Ache yes VAS scale: 2/10 Pain location: shoulder Pain orientation: Right  PAIN TYPE: aching Pain description: intermittent  Aggravating factors: R sidelying/ sleep position Relieving factors: position change       OBJECTIVE:    DIAGNOSTIC FINDINGS:  Right shoulder: Full active and passive range of motion.  Positive painful arc.  Positive empty can, positive Hawkins.  He does have slight amount of weakness with resisted supraspinatus and external rotation on the right compared to the left.  No atrophy.  Neurovascularly intact distally.  Complete MSK ultrasound of the right shoulder: Biceps tendon was well visualized in both long and short axis.  There is fluid surrounding the biceps tendon proximally.  Subscapularis was well visualized in the long axis  without abnormality.  Supraspinatus was visualized in both long and short axis.  There are cortical changes in the humeral head as well as an area of hypoechoic change in the anterior fibers of the supraspinatus consistent with a probable tear here.  There has also been some loss of the normally convex tendon structure.  Infraspinatus tendon is well visualized in long axis without obvious abnormality.  Acromioclavicular joint shows mild degenerative changes with a positive mushroom sign.  Posterior  glenohumeral joint appears unremarkable. These findings are consistent with a probable full-thickness supraspinatus tendon tear along the anterior leading edge.   PATIENT SURVEYS:            FOTO: shoulder 2   COGNITION:          Overall cognitive status: Within functional limits for tasks assessed                               SENSATION:          Light touch: Appears intact             POSTURE: Forward head rounded shoulders   PALPATION: TP to R infraspinatus    UPPER EXTREMITY AROM/PROM:   A/PROM Right 01/12/2021 Left 01/12/2021  Shoulder flexion 160/170    Shoulder extension wnl    Shoulder abduction 160/175    Shoulder adduction      Shoulder internal rotation Wnl/70    Shoulder external rotation Wnl/70    Elbow flexion      Elbow extension      Wrist flexion      Wrist extension      Wrist ulnar deviation      Wrist radial deviation      Wrist pronation      Wrist supination      (Blank rows = not tested, L side WNL)   UPPER EXTREMITY MMT:   MMT Right 01/12/2021 Left 01/12/2021  Shoulder flexion 4    Shoulder extension 4    Shoulder abduction 4                                                    scaption 4-    (Blank rows = not tested,L side WNL )   SHOULDER SPECIAL TESTS:           Impingement tests: Neer impingement test: negative, Hawkins/Kennedy impingement test: negative, and Painful arc test: negative            JOINT MOBILITY TESTING:  unremarkable   PALPATION:  TP to infraspinatus   POSTURE:  Forward head and rounded shoulders  [facility] Adult PT Treatment:                                                DATE: 02/01/21 Therapeutic Exercise: UBE L2.5 3/3 min Supine flexion 2# 30x Supine press and protract 2# 30x Sidelie abduction 1# 30x Sidelie ER 1# 30x Prone flexion, 2# x30 Prone scaption 2# x30 Prone hor abd 15/15, IR/ER, 2# Prone extension, 2# x30 Seated scaption, 2# 30x Manual Therapy: TP release technique R  infraspinatus in prone, 8'       [facility]  Adult PT Treatment:                                                DATE: 01/30/21 Therapeutic Exercise: UBE L2 3/3 min Supine shoulder flexion, unilateral, red band 15x per side Supine hor abd 30x red band followed by unilateral 15x per side Supine flexion 2# 30x Supine press and protract 2# 30x Sidelie abduction 1# 30x Sidelie ER 1# 30x Seated scaption, 2# 30x Manual Therapy: PROM, GH mobs inferior and posterior TP release R infraspinatus D1 F/E 30x with PT assist      [facility] Adult PT Treatment:                                                DATE: 01/25/21 Therapeutic Exercise: UBE L1 3/3 min Supine shoulder flexion, unilateral, yellow band 15x per side Supine hor abd 30x yellow band followed by unilateral 15x per side Rhythmic stabilization at 60/90/120d flexion, 60s hold Sidelie abduction 1# 30x Sidelie ER 1# 30x Manual Therapy: TP release to R infraspinatus followed by prolonged IR stretch x2 min      PATIENT EDUCATION: Education details: Discussed eval findings, rehab rationale and POC and patient is in agreement Person educated: Patient Education method: Explanation, Demonstration, and Handouts Education comprehension: verbalized understanding, returned demonstration, and needs further education     HOME EXERCISE PROGRAM: Access Code: [email or web address] URL: [email or web address] Date: 02/01/2021 Prepared by: Sharlynn Oliphant  Exercises Sidelying Shoulder Abduction Palm Forward - 2 x daily - 7 x weekly - 1 sets - 30 reps Standing Shoulder Scaption - 2 x daily - 7 x weekly - 1 sets - 30 reps Standing Shoulder Internal Rotation Stretch with Towel - 2 x daily - 7 x weekly - 1 sets - 1 reps - 120s hold    ASSESSMENT:   CLINICAL IMPRESSION: Still reporting a nagging ache in posterior R shoulder, most notable during sleep. Advanced to prone scapular strengthening to address weakness.  Added IR stretch to HEP   REHAB POTENTIAL:  Good   CLINICAL DECISION MAKING: Stable/uncomplicated   EVALUATION COMPLEXITY: Low     GOALS: Goals reviewed with patient? Yes   SHORT TERM GOALS:   STG Name Target Date Goal status  1 STGs=LTGs   01/26/2021 INITIAL                                                          LONG TERM GOALS:    LTG Name Target Date Goal status  1 Patient to demonstrate independence in HEP  Baseline:[id number] 02/09/2021 INITIAL  2 Increase R shoulder strength to 4+/5 in all deficit areas Baseline: MMT Right 01/11/2021 Left 01/11/2021  Shoulder flexion 4    Shoulder extension 4    Shoulder abduction 4  scaption 4-     02/09/2021 INITIAL  3 Increase R shoulder AROM to 170d of flexion and abduction Baseline: A/PROM Right 01/11/2021 Left 01/11/2021  Shoulder flexion 160/170    Shoulder extension wnl    Shoulder abduction 160/175    Shoulder adduction      Shoulder internal rotation Wnl/70    Shoulder external rotation Wnl/70     02/09/2021 INITIAL  4 Increase FOTO score to 75 Baseline:57 02/09/2021 INITIAL  5 Restore normal scapulohumeral and scapulothoracic rhythm with AROM Baseline:R scapular elevation with flexion and abduction 02/09/2021 INITIAL                      PLAN: PT FREQUENCY: 2x/week   PT DURATION: 4 weeks   PLANNED INTERVENTIONS: Therapeutic exercises, Therapeutic activity, Neuro Muscular re-education, Balance training, Gait training, Patient/Family education, Joint mobilization, and Manual therapy   PLAN FOR NEXT SESSION: Posterior shoulder strengthening, HEP update, ROM, supraspinatus re-ed, TP techniques for R infraspinatus, add resistance as tolerated, IR stretch    Jacqulynn Cadet Tikesha Mort PT 02/01/2021, 10:45 AM

## 2021-02-02 ENCOUNTER — Ambulatory Visit
Admission: RE | Admit: 2021-02-02 | Discharge: 2021-02-02 | Disposition: A | Discharge status: Home / Self Care | Payer: No Typology Code available for payment source | Source: Ambulatory Visit | Attending: Urology | Admitting: Urology

## 2021-02-02 DIAGNOSIS — N403 Nodular prostate with lower urinary tract symptoms: Secondary | ICD-10-CM

## 2021-02-02 DIAGNOSIS — N402 Nodular prostate without lower urinary tract symptoms: Secondary | ICD-10-CM | POA: Diagnosis not present

## 2021-02-02 DIAGNOSIS — R972 Elevated prostate specific antigen [PSA]: Secondary | ICD-10-CM | POA: Diagnosis not present

## 2021-02-02 DIAGNOSIS — R339 Retention of urine, unspecified: Secondary | ICD-10-CM | POA: Diagnosis not present

## 2021-02-02 DIAGNOSIS — N400 Enlarged prostate without lower urinary tract symptoms: Secondary | ICD-10-CM | POA: Diagnosis not present

## 2021-02-02 MED ORDER — GADOBENATE DIMEGLUMINE 529 MG/ML IV SOLN
14.0000 mL | Freq: Once | INTRAVENOUS | Status: AC | PRN
  Administered 2021-02-02: 14 mL via INTRAVENOUS

## 2021-02-13 DIAGNOSIS — R3912 Poor urinary stream: Secondary | ICD-10-CM | POA: Diagnosis not present

## 2021-02-13 DIAGNOSIS — R3914 Feeling of incomplete bladder emptying: Secondary | ICD-10-CM | POA: Diagnosis not present

## 2021-02-13 DIAGNOSIS — N403 Nodular prostate with lower urinary tract symptoms: Secondary | ICD-10-CM | POA: Diagnosis not present

## 2021-02-15 ENCOUNTER — Encounter: Payer: Self-pay | Admitting: Cardiology

## 2021-02-16 NOTE — Telephone Encounter (Signed)
from pt

## 2021-02-20 ENCOUNTER — Ambulatory Visit: Payer: Medicare Other

## 2021-02-20 ENCOUNTER — Other Ambulatory Visit: Payer: Self-pay

## 2021-02-20 DIAGNOSIS — M6281 Muscle weakness (generalized): Secondary | ICD-10-CM

## 2021-02-20 DIAGNOSIS — M25511 Pain in right shoulder: Secondary | ICD-10-CM | POA: Diagnosis not present

## 2021-02-20 DIAGNOSIS — M7541 Impingement syndrome of right shoulder: Secondary | ICD-10-CM

## 2021-02-20 NOTE — Therapy (Signed)
OUTPATIENT PHYSICAL THERAPY TREATMENT NOTE   Patient Name: Joseph Pitts MRN: [id number] DOB:September 01, 1945, 76 y.o., male Today's Date: 02/20/2021  PCP: Lajean Manes, MD REFERRING PROVIDER: Lajean Manes, MD   PT End of Session - 02/20/21 1052     Visit Number 5    Number of Visits 6    Date for PT Re-Evaluation 02/09/21    Authorization Type MCR/MCD    Authorization Time Period 01/11/21-02/09/21    Progress Note Due on Visit 8    PT Start Time 1050    PT Stop Time 1135    PT Time Calculation (min) 45 min    Activity Tolerance Patient tolerated treatment well    Behavior During Therapy [facility] for tasks assessed/performed             Past Medical History:  Diagnosis Date   Cancer ([place])    basal cell on nose removed   Diverticulosis    Encounter for loop recorder check 08/04/2018   Hyperlipidemia    Loop recorder in situ: Biotroik 06/20/18 for cryptogenic stroke.  06/20/2018   Scheduled Remote loop recorder check 07/30/2018: There were 0 symptomatic patient activations recorded. No mode switches. NSR. No A. Fib.   TIA (transient ischemic attack) 04/08/2018   Tubular adenoma of colon 2016   Vascular occlusion    retinal vein occlusion   Past Surgical History:  Procedure Laterality Date   COLONOSCOPY  04/30/2014   LOOP RECORDER IMPLANT  06/20/2018   Biotronik Loop Recorder. Serial # [id number]  for cryptogenic stroke    POLYPECTOMY     TONSILLECTOMY     Patient Active Problem List   Diagnosis Date Noted   Neuropathy of left ulnar nerve at wrist 10/19/2020   Wrist sprain, left, initial encounter 10/17/2020   Encounter for loop recorder check 08/04/2018   Loop recorder in situ: Biotroik 06/20/18 for cryptogenic stroke.  06/20/2018   History of recurrent TIAs 06/18/2018   Hypercholesteremia 06/18/2018   Essential hypertension 04/09/2018   History of tobacco use 04/09/2018   TIA (transient ischemic attack) 04/08/2018   Degeneration disease of medial meniscus  01/26/2016   Strain of acromioclavicular joint 06/29/2015   Left shoulder pain 10/29/2013   Inguinal hernia 02/03/2013   First degree heart block 02/03/2013   Retinal hemorrhage of left eye 02/03/2013   Dyslipidemia 07/22/2012   Erectile dysfunction 07/22/2012    REFERRING DIAG: R shoulder impingement  THERAPY DIAG:  Acute pain of right shoulder  Impingement syndrome of right shoulder  Muscle weakness (generalized)  PERTINENT HISTORY: see eval  PRECAUTIONS: none  SUBJECTIVE: Returns from vacation and was able to swim and snorkel w/o difficulty.  Sleeping on R arm is better.  Still uncomfortable with abduction and OH reaching but declines sharp pain.  PAIN:  Are you having pain? No. Ache yes VAS scale: 2/10 Pain location: shoulder Pain orientation: Right  PAIN TYPE: aching Pain description: intermittent  Aggravating factors: R sidelying/ sleep position Relieving factors: position change       OBJECTIVE:    DIAGNOSTIC FINDINGS:  Right shoulder: Full active and passive range of motion.  Positive painful arc.  Positive empty can, positive Hawkins.  He does have slight amount of weakness with resisted supraspinatus and external rotation on the right compared to the left.  No atrophy.  Neurovascularly intact distally.  Complete MSK ultrasound of the right shoulder: Biceps tendon was well visualized in both long and short axis.  There is fluid surrounding the biceps tendon proximally.  Subscapularis was well visualized in the long axis without abnormality.  Supraspinatus was visualized in both long and short axis.  There are cortical changes in the humeral head as well as an area of hypoechoic change in the anterior fibers of the supraspinatus consistent with a probable tear here.  There has also been some loss of the normally convex tendon structure.  Infraspinatus tendon is well visualized in long axis without obvious abnormality.  Acromioclavicular joint shows mild  degenerative changes with a positive mushroom sign.  Posterior glenohumeral joint appears unremarkable. These findings are consistent with a probable full-thickness supraspinatus tendon tear along the anterior leading edge.   PATIENT SURVEYS:            FOTO: shoulder 66   COGNITION:          Overall cognitive status: Within functional limits for tasks assessed                               SENSATION:          Light touch: Appears intact             POSTURE: Forward head rounded shoulders   PALPATION: TP to R infraspinatus    UPPER EXTREMITY AROM/PROM:   A/PROM Right 01/12/2021 Left 01/12/2021  Shoulder flexion 160/170    Shoulder extension wnl    Shoulder abduction 160/175    Shoulder adduction      Shoulder internal rotation Wnl/70    Shoulder external rotation Wnl/70    Elbow flexion      Elbow extension      Wrist flexion      Wrist extension      Wrist ulnar deviation      Wrist radial deviation      Wrist pronation      Wrist supination      (Blank rows = not tested, L side WNL)   UPPER EXTREMITY MMT:   MMT Right 01/12/2021 Left 01/12/2021  Shoulder flexion 4    Shoulder extension 4    Shoulder abduction 4                                                    scaption 4-    (Blank rows = not tested,L side WNL )   SHOULDER SPECIAL TESTS:          (02/21/19) Impingement tests: Neer impingement test: negative, Hawkins/Kennedy impingement test: negative, Painful arc test: positive, Hornblower's             JOINT MOBILITY TESTING:  unremarkable   PALPATION:  TP to infraspinatus   POSTURE:  Forward head and rounded shoulders  [facility] Adult PT Treatment:                                                DATE: 02/20/21 Therapeutic Exercise: Supine flexion 2# 30x Supine press and protract 2# 30x Sidelie abduction 2# 30x Sidelie ER 2# 30x Prone flexion, 2# x30 Prone scaption 2# x30 Prone hor abd 15/15, IR/ER, 2# Prone extension, 2# x30 Corner  stretch, 30s, 3 positions UBE L2.0 3/3 min  [facility] Adult PT Treatment:  DATE: 02/01/21 Therapeutic Exercise: UBE L2.5 3/3 min Supine flexion 2# 30x Supine press and protract 2# 30x Sidelie abduction 1# 30x Sidelie ER 1# 30x Prone flexion, 2# x30 Prone scaption 2# x30 Prone hor abd 15/15, IR/ER, 2# Prone extension, 2# x30 Seated scaption, 2# 30x Manual Therapy: TP release technique R infraspinatus in prone, 8'       [facility] Adult PT Treatment:                                                DATE: 01/30/21 Therapeutic Exercise: UBE L2 3/3 min Supine shoulder flexion, unilateral, red band 15x per side Supine hor abd 30x red band followed by unilateral 15x per side Supine flexion 2# 30x Supine press and protract 2# 30x Sidelie abduction 1# 30x Sidelie ER 1# 30x Seated scaption, 2# 30x Manual Therapy: PROM, GH mobs inferior and posterior TP release R infraspinatus D1 F/E 30x with PT assist           PATIENT EDUCATION: Education details: Discussed eval findings, rehab rationale and POC and patient is in agreement Person educated: Patient Education method: Explanation, Demonstration, and Handouts Education comprehension: verbalized understanding, returned demonstration, and needs further education     HOME EXERCISE PROGRAM: Access Code: [email or web address] URL: [email or web address] Date: 02/01/2021 Prepared by: Sharlynn Oliphant  Exercises Sidelying Shoulder Abduction Palm Forward - 2 x daily - 7 x weekly - 1 sets - 30 reps Standing Shoulder Scaption - 2 x daily - 7 x weekly - 1 sets - 30 reps Standing Shoulder Internal Rotation Stretch with Towel - 2 x daily - 7 x weekly - 1 sets - 1 reps - 120s hold    ASSESSMENT:   CLINICAL IMPRESSION: Returns from vacation and was able to sleep on his side better as well as tolerate swimming/snorkeling, impingement signs negative but painful arc remains positive, Hornblower's sign  positive for weakness.  Symptoms suggest potential partial thickness RC tear   REHAB POTENTIAL: Good   CLINICAL DECISION MAKING: Stable/uncomplicated   EVALUATION COMPLEXITY: Low     GOALS: Goals reviewed with patient? Yes   SHORT TERM GOALS:   STG Name Target Date Goal status  1 STGs=LTGs   01/26/2021 INITIAL  LONG TERM GOALS:    LTG Name Target Date Goal status  1 Patient to demonstrate independence in HEP  Baseline:[id number] 02/09/2021 INITIAL  2 Increase R shoulder strength to 4+/5 in all deficit areas Baseline: MMT Right 01/11/2021 Left 01/11/2021  Shoulder flexion 4    Shoulder extension 4    Shoulder abduction 4                                                                                        scaption 4-     02/09/2021 INITIAL  3 Increase R shoulder AROM to 170d of flexion and abduction Baseline: A/PROM Right 01/11/2021 Left 01/11/2021  Shoulder flexion 160/170    Shoulder extension wnl    Shoulder abduction 160/175    Shoulder adduction  Shoulder internal rotation Wnl/70    Shoulder external rotation Wnl/70     02/09/2021 INITIAL  4 Increase FOTO score to 75 Baseline:57 02/09/2021 INITIAL  5 Restore normal scapulohumeral and scapulothoracic rhythm with AROM Baseline:R scapular elevation with flexion and abduction 02/09/2021 INITIAL  PLAN: PT FREQUENCY: 2x/week   PT DURATION: 4 weeks   PLANNED INTERVENTIONS: Therapeutic exercises, Therapeutic activity, Neuro Muscular re-education, Balance training, Gait training, Patient/Family education, Joint mobilization, and Manual therapy   PLAN FOR NEXT SESSION: Assess for DC    Lanice Shirts PT 02/20/2021, 11:06 AM

## 2021-02-22 ENCOUNTER — Ambulatory Visit: Payer: Medicare Other | Attending: Sports Medicine

## 2021-02-22 ENCOUNTER — Other Ambulatory Visit: Payer: Self-pay

## 2021-02-22 DIAGNOSIS — M7541 Impingement syndrome of right shoulder: Secondary | ICD-10-CM | POA: Diagnosis not present

## 2021-02-22 DIAGNOSIS — M25511 Pain in right shoulder: Secondary | ICD-10-CM | POA: Insufficient documentation

## 2021-02-22 DIAGNOSIS — M6281 Muscle weakness (generalized): Secondary | ICD-10-CM | POA: Diagnosis not present

## 2021-02-22 NOTE — Therapy (Signed)
OUTPATIENT PHYSICAL THERAPY TREATMENT NOTE/DC Summary   Patient Name: BRANTON EINSTEIN MRN: [id number] DOB:1945/02/24, 76 y.o., male Today's Date: 02/22/2021  PCP: Lajean Manes, MD REFERRING PROVIDER: Thurman Coyer, DO  PHYSICAL THERAPY DISCHARGE SUMMARY  Visits from Start of Care: 6  Current functional level related to goals / functional outcomes: Rehab goals met   Remaining deficits: Occasional discomfort   Education / Equipment: HEP   Patient agrees to discharge. Patient goals were met. Patient is being discharged due to being pleased with the current functional level.    PT End of Session - 02/22/21 1057     Visit Number 6    Number of Visits 6    Date for PT Re-Evaluation 02/09/21    Authorization Type MCR/MCD    Authorization Time Period 01/11/21-02/09/21    Progress Note Due on Visit 8    PT Start Time 1050    PT Stop Time 1130    PT Time Calculation (min) 40 min    Activity Tolerance Patient tolerated treatment well    Behavior During Therapy [facility] for tasks assessed/performed             Past Medical History:  Diagnosis Date   Cancer ([place])    basal cell on nose removed   Diverticulosis    Encounter for loop recorder check 08/04/2018   Hyperlipidemia    Loop recorder in situ: Biotroik 06/20/18 for cryptogenic stroke.  06/20/2018   Scheduled Remote loop recorder check 07/30/2018: There were 0 symptomatic patient activations recorded. No mode switches. NSR. No A. Fib.   TIA (transient ischemic attack) 04/08/2018   Tubular adenoma of colon 2016   Vascular occlusion    retinal vein occlusion   Past Surgical History:  Procedure Laterality Date   COLONOSCOPY  04/30/2014   LOOP RECORDER IMPLANT  06/20/2018   Biotronik Loop Recorder. Serial # [id number]  for cryptogenic stroke    POLYPECTOMY     TONSILLECTOMY     Patient Active Problem List   Diagnosis Date Noted   Neuropathy of left ulnar nerve at wrist 10/19/2020   Wrist sprain, left, initial  encounter 10/17/2020   Encounter for loop recorder check 08/04/2018   Loop recorder in situ: Biotroik 06/20/18 for cryptogenic stroke.  06/20/2018   History of recurrent TIAs 06/18/2018   Hypercholesteremia 06/18/2018   Essential hypertension 04/09/2018   History of tobacco use 04/09/2018   TIA (transient ischemic attack) 04/08/2018   Degeneration disease of medial meniscus 01/26/2016   Strain of acromioclavicular joint 06/29/2015   Left shoulder pain 10/29/2013   Inguinal hernia 02/03/2013   First degree heart block 02/03/2013   Retinal hemorrhage of left eye 02/03/2013   Dyslipidemia 07/22/2012   Erectile dysfunction 07/22/2012    REFERRING DIAG: R shoulder impingement  THERAPY DIAG:  Impingement syndrome of right shoulder  Acute pain of right shoulder  Muscle weakness (generalized)  PERTINENT HISTORY: see eval  PRECAUTIONS: none  SUBJECTIVE: No changes since last session, scheduled for f/u with Dr. Micheline Chapman next week  PAIN:  Are you having pain? No. Ache yes VAS scale: 2/10 Pain location: shoulder Pain orientation: Right  PAIN TYPE: aching Pain description: intermittent  Aggravating factors: R sidelying/ sleep position Relieving factors: position change       OBJECTIVE:    DIAGNOSTIC FINDINGS:  Right shoulder: Full active and passive range of motion.  Positive painful arc.  Positive empty can, positive Hawkins.  He does have slight amount of weakness with resisted supraspinatus and  external rotation on the right compared to the left.  No atrophy.  Neurovascularly intact distally.  Complete MSK ultrasound of the right shoulder: Biceps tendon was well visualized in both long and short axis.  There is fluid surrounding the biceps tendon proximally.  Subscapularis was well visualized in the long axis without abnormality.  Supraspinatus was visualized in both long and short axis.  There are cortical changes in the humeral head as well as an area of hypoechoic change  in the anterior fibers of the supraspinatus consistent with a probable tear here.  There has also been some loss of the normally convex tendon structure.  Infraspinatus tendon is well visualized in long axis without obvious abnormality.  Acromioclavicular joint shows mild degenerative changes with a positive mushroom sign.  Posterior glenohumeral joint appears unremarkable. These findings are consistent with a probable full-thickness supraspinatus tendon tear along the anterior leading edge.   PATIENT SURVEYS:            FOTO: shoulder 71(02/22/21)   COGNITION:          Overall cognitive status: Within functional limits for tasks assessed                               SENSATION:          Light touch: Appears intact             POSTURE: Forward head rounded shoulders   PALPATION: TP to R infraspinatus    UPPER EXTREMITY AROM/PROM:   A/PROM Right 02/22/21 Left 01/12/2021  Shoulder flexion 175/175    Shoulder extension wnl    Shoulder abduction 175/175    Shoulder adduction      Shoulder internal rotation WNL    Shoulder external rotation WNL    Elbow flexion      Elbow extension      Wrist flexion      Wrist extension      Wrist ulnar deviation      Wrist radial deviation      Wrist pronation      Wrist supination      (Blank rows = not tested, L side WNL)   UPPER EXTREMITY MMT:   MMT Right 01/12/2021 Left 01/12/2021  Shoulder flexion 5    Shoulder extension 5    Shoulder abduction 5-                                                    scaption 5-    (Blank rows = not tested,L side WNL )   SHOULDER SPECIAL TESTS:          (02/21/19) Impingement tests: Neer impingement test: negative, Hawkins/Kennedy impingement test: negative, Painful arc test: positive, Hornblower's             JOINT MOBILITY TESTING:  unremarkable   PALPATION:  TP to infraspinatus   POSTURE:  Forward head and rounded shoulders  [facility] Adult PT Treatment:                                                 DATE: 02/22/21 Therapeutic Exercise: Supine flexion 2# 30x Supine press and  protract 2# 30x Sidelie abduction 2# 30x Sidelie ER 2# 30x Prone flexion, 2# x30 Prone scaption 2# x30 Prone hor abd 15/15, IR/ER, 2# Prone extension, 2# x30 Standing scapular depression  UBE L3.0 3/3 min    [facility] Adult PT Treatment:                                                DATE: 02/20/21 Therapeutic Exercise: Supine flexion 2# 30x Supine press and protract 2# 30x Sidelie abduction 2# 30x Sidelie ER 2# 30x Prone flexion, 2# x30 Prone scaption 2# x30 Prone hor abd 15/15, IR/ER, 2# Prone extension, 2# x30 Corner stretch, 30s, 3 positions UBE L2.0 3/3 min  [facility] Adult PT Treatment:                                                DATE: 02/01/21 Therapeutic Exercise: UBE L2.5 3/3 min Supine flexion 2# 30x Supine press and protract 2# 30x Sidelie abduction 1# 30x Sidelie ER 1# 30x Prone flexion, 2# x30 Prone scaption 2# x30 Prone hor abd 15/15, IR/ER, 2# Prone extension, 2# x30 Seated scaption, 2# 30x Manual Therapy: TP release technique R infraspinatus in prone, 8'     PATIENT EDUCATION: Education details: Discussed eval findings, rehab rationale and POC and patient is in agreement Person educated: Patient Education method: Explanation, Demonstration, and Handouts Education comprehension: verbalized understanding, returned demonstration, and needs further education     HOME EXERCISE PROGRAM:  Access Code: [email or web address] URL: [email or web address] Date: 02/22/2021 Prepared by: Sharlynn Oliphant  Exercises Standing Shoulder Scaption - 2 x daily - 7 x weekly - 1 sets - 30 reps Seated Shoulder Press Ups Off Table - 2 x daily - 7 x weekly - 2 sets - 15 reps Single Arm Scapular Depression with Anchored Resistance - 2 x daily - 7 x weekly - 2 sets - 15 reps    ASSESSMENT:   CLINICAL IMPRESSION: Rehab goals met, patient has regained full ROM and functional strength  with some trace weakness and discomfort with ER.  Symptoms suggest potential partial thickness RC tear   REHAB POTENTIAL: Good   CLINICAL DECISION MAKING: Stable/uncomplicated   EVALUATION COMPLEXITY: Low     GOALS: Goals reviewed with patient? Yes   SHORT TERM GOALS:   STG Name Target Date Goal status  1 STGs=LTGs   01/26/2021 MET  LONG TERM GOALS:    LTG Name Target Date Goal status  1 Patient to demonstrate independence in HEP  Baseline:[id number] 02/09/2021 MET  2 Increase R shoulder strength to 4+/5 in all deficit areas Baseline: MMT Right 01/11/2021 Left 01/11/2021  Shoulder flexion 4    Shoulder extension 4    Shoulder abduction 4                                                                                        scaption 4-  02/09/2021 MET  3 Increase R shoulder AROM to 170d of flexion and abduction Baseline: A/PROM Right 01/11/2021 Left 01/11/2021  Shoulder flexion 160/170    Shoulder extension wnl    Shoulder abduction 160/175    Shoulder adduction      Shoulder internal rotation Wnl/70    Shoulder external rotation Wnl/70     02/09/2021 MET  4 Increase FOTO score to 75 Baseline:57 02/09/2021 INITIAL  5 Restore normal scapulohumeral and scapulothoracic rhythm with AROM Baseline:R scapular elevation with flexion and abduction 02/09/2021 Able to demo when cued, goal met  PLAN: PT FREQUENCY: 2x/week   PT DURATION: 4 weeks   PLANNED INTERVENTIONS: Therapeutic exercises, Therapeutic activity, Neuro Muscular re-education, Balance training, Gait training, Patient/Family education, Joint mobilization, and Manual therapy   PLAN FOR NEXT SESSION: DC to MD care    Lanice Shirts PT 02/22/2021, 10:58 AM

## 2021-02-23 ENCOUNTER — Ambulatory Visit: Payer: Medicare Other | Admitting: Sports Medicine

## 2021-02-27 ENCOUNTER — Encounter: Payer: Self-pay | Admitting: Cardiology

## 2021-02-27 ENCOUNTER — Other Ambulatory Visit: Payer: Self-pay

## 2021-02-27 ENCOUNTER — Ambulatory Visit: Payer: 59 | Admitting: Cardiology

## 2021-02-27 VITALS — BP 142/75 | HR 91 | Temp 97.8°F | Resp 16 | Ht 68.0 in | Wt 159.8 lb

## 2021-02-27 DIAGNOSIS — Z4509 Encounter for adjustment and management of other cardiac device: Secondary | ICD-10-CM

## 2021-02-27 DIAGNOSIS — Z9889 Other specified postprocedural states: Secondary | ICD-10-CM | POA: Diagnosis not present

## 2021-02-27 DIAGNOSIS — G459 Transient cerebral ischemic attack, unspecified: Secondary | ICD-10-CM | POA: Diagnosis not present

## 2021-02-27 DIAGNOSIS — Z8673 Personal history of transient ischemic attack (TIA), and cerebral infarction without residual deficits: Secondary | ICD-10-CM

## 2021-02-27 NOTE — Progress Notes (Signed)
Chief Complaint  Patient presents with   Loop removal   Vitals:   02/27/21 1530  BP: (!) 142/75  Pulse: 91  Resp: 16  Temp: 97.8 F (36.6 C)  SpO2: 98%    Cardiac exam: Normal S1 and S2. Chest exam: Clear to auscultate, normal breath sounds.    ICD-10-CM   1. Loop recorder: [employer]. Serial # [id number] in situ 06/20/18 for cryptogenic stroke  Z98.890     2. Encounter for loop recorder at end of battery life  Z45.09       Procedure performed: Removal of loop recorder, Biotronik biomonitor 3 implanted 06/20/2018 for cryptogenic stroke.  After informed consent, the loop recorder area was infiltrated with lidocaine.  A small nick was made at the top edge of the loop recorder, this was followed by easily retrieving the loop recorder with hemostats without complication.  The wound was then closed with Dermabond and a Band-Aid was applied.  No immediate complications.  Patient was advised to keep the wound area clean and dry for the next 2 days and he can shower after that but again needs to continue to keep the area dry.  He will call if there is any discharge or swelling or pain.   Adrian Prows, MD, [facility] 02/27/2021, 4:46 PM Office: [phone] Fax: [phone] Pager: [phone]

## 2021-02-28 ENCOUNTER — Ambulatory Visit (INDEPENDENT_AMBULATORY_CARE_PROVIDER_SITE_OTHER): Payer: Medicare Other | Admitting: Sports Medicine

## 2021-02-28 VITALS — BP 128/82 | Ht 68.0 in | Wt 150.0 lb

## 2021-02-28 DIAGNOSIS — M25511 Pain in right shoulder: Secondary | ICD-10-CM

## 2021-02-28 NOTE — Progress Notes (Signed)
° °  Subjective:    Patient ID: Joseph Pitts, male    DOB: 08-23-1945, 76 y.o.   MRN: [id number]  HPI  [name] presents today for follow-up on a right shoulder rotator cuff tear.  Overall, he feels like he has improved.  He still notices some weakness with external rotation still some difficulty sleeping at night but those are both improved since his last office visit.  He has been very diligent about physical therapy and was recently discharged to home exercise program.  He has eliminated all overhead exercises.    Review of Systems As above    Objective:   Physical Exam  Well-developed, well-nourished.  No acute distress  Right shoulder: Full range of motion.  His supraspinatus strength today is 5/5 and infraspinatus strength is 4+/5 but this does reproduce some shoulder pain.  He is neurovascular intact distally.      Assessment & Plan:   Improved right shoulder pain secondary to rotator cuff tear  I recommend that [name] continue with his home exercises.  Hopefully over time his pain will continue to improve.  At this point in time I would not recommend any sort of surgical intervention.  If his pain worsens down the road then we can reconsider a referral.  Follow-up as needed.  This note was dictated using Dragon naturally speaking software and may contain errors in syntax, spelling, or content which have not been identified prior to signing this note.

## 2021-03-14 DIAGNOSIS — L820 Inflamed seborrheic keratosis: Secondary | ICD-10-CM | POA: Diagnosis not present

## 2021-03-14 DIAGNOSIS — L570 Actinic keratosis: Secondary | ICD-10-CM | POA: Diagnosis not present

## 2021-03-14 DIAGNOSIS — L821 Other seborrheic keratosis: Secondary | ICD-10-CM | POA: Diagnosis not present

## 2021-03-14 DIAGNOSIS — L814 Other melanin hyperpigmentation: Secondary | ICD-10-CM | POA: Diagnosis not present

## 2021-03-14 DIAGNOSIS — D225 Melanocytic nevi of trunk: Secondary | ICD-10-CM | POA: Diagnosis not present

## 2021-03-14 DIAGNOSIS — D1801 Hemangioma of skin and subcutaneous tissue: Secondary | ICD-10-CM | POA: Diagnosis not present

## 2021-03-14 DIAGNOSIS — Z85828 Personal history of other malignant neoplasm of skin: Secondary | ICD-10-CM | POA: Diagnosis not present

## 2021-03-15 DIAGNOSIS — R3912 Poor urinary stream: Secondary | ICD-10-CM | POA: Diagnosis not present

## 2021-03-15 DIAGNOSIS — R3914 Feeling of incomplete bladder emptying: Secondary | ICD-10-CM | POA: Diagnosis not present

## 2021-03-15 DIAGNOSIS — N139 Obstructive and reflux uropathy, unspecified: Secondary | ICD-10-CM | POA: Diagnosis not present

## 2021-03-15 DIAGNOSIS — N5201 Erectile dysfunction due to arterial insufficiency: Secondary | ICD-10-CM | POA: Diagnosis not present

## 2021-03-15 DIAGNOSIS — N403 Nodular prostate with lower urinary tract symptoms: Secondary | ICD-10-CM | POA: Diagnosis not present

## 2021-03-20 DIAGNOSIS — Z20822 Contact with and (suspected) exposure to covid-19: Secondary | ICD-10-CM | POA: Diagnosis not present

## 2021-03-30 DIAGNOSIS — Z20828 Contact with and (suspected) exposure to other viral communicable diseases: Secondary | ICD-10-CM | POA: Diagnosis not present

## 2021-04-05 DIAGNOSIS — Z20822 Contact with and (suspected) exposure to covid-19: Secondary | ICD-10-CM | POA: Diagnosis not present

## 2021-04-11 DIAGNOSIS — H11823 Conjunctivochalasis, bilateral: Secondary | ICD-10-CM | POA: Diagnosis not present

## 2021-04-11 DIAGNOSIS — G43109 Migraine with aura, not intractable, without status migrainosus: Secondary | ICD-10-CM | POA: Diagnosis not present

## 2021-04-11 DIAGNOSIS — H35352 Cystoid macular degeneration, left eye: Secondary | ICD-10-CM | POA: Diagnosis not present

## 2021-04-11 DIAGNOSIS — H348322 Tributary (branch) retinal vein occlusion, left eye, stable: Secondary | ICD-10-CM | POA: Diagnosis not present

## 2021-04-11 DIAGNOSIS — H0102A Squamous blepharitis right eye, upper and lower eyelids: Secondary | ICD-10-CM | POA: Diagnosis not present

## 2021-04-11 DIAGNOSIS — D492 Neoplasm of unspecified behavior of bone, soft tissue, and skin: Secondary | ICD-10-CM | POA: Diagnosis not present

## 2021-04-11 DIAGNOSIS — H0102B Squamous blepharitis left eye, upper and lower eyelids: Secondary | ICD-10-CM | POA: Diagnosis not present

## 2021-04-11 DIAGNOSIS — H1131 Conjunctival hemorrhage, right eye: Secondary | ICD-10-CM | POA: Diagnosis not present

## 2021-04-11 DIAGNOSIS — H04123 Dry eye syndrome of bilateral lacrimal glands: Secondary | ICD-10-CM | POA: Diagnosis not present

## 2021-04-11 DIAGNOSIS — H2513 Age-related nuclear cataract, bilateral: Secondary | ICD-10-CM | POA: Diagnosis not present

## 2021-04-27 DIAGNOSIS — Z20822 Contact with and (suspected) exposure to covid-19: Secondary | ICD-10-CM | POA: Diagnosis not present

## 2021-05-05 DIAGNOSIS — R600 Localized edema: Secondary | ICD-10-CM | POA: Diagnosis not present

## 2021-05-08 DIAGNOSIS — Z20822 Contact with and (suspected) exposure to covid-19: Secondary | ICD-10-CM | POA: Diagnosis not present

## 2021-05-12 DIAGNOSIS — Z23 Encounter for immunization: Secondary | ICD-10-CM | POA: Diagnosis not present

## 2021-05-16 DIAGNOSIS — Z20822 Contact with and (suspected) exposure to covid-19: Secondary | ICD-10-CM | POA: Diagnosis not present

## 2021-05-29 DIAGNOSIS — Z20822 Contact with and (suspected) exposure to covid-19: Secondary | ICD-10-CM | POA: Diagnosis not present

## 2021-06-21 DIAGNOSIS — N401 Enlarged prostate with lower urinary tract symptoms: Secondary | ICD-10-CM | POA: Diagnosis not present

## 2021-06-21 DIAGNOSIS — R3914 Feeling of incomplete bladder emptying: Secondary | ICD-10-CM | POA: Diagnosis not present

## 2021-07-12 DIAGNOSIS — C44619 Basal cell carcinoma of skin of left upper limb, including shoulder: Secondary | ICD-10-CM | POA: Diagnosis not present

## 2021-07-12 DIAGNOSIS — D485 Neoplasm of uncertain behavior of skin: Secondary | ICD-10-CM | POA: Diagnosis not present

## 2021-07-12 DIAGNOSIS — Z85828 Personal history of other malignant neoplasm of skin: Secondary | ICD-10-CM | POA: Diagnosis not present

## 2021-07-12 DIAGNOSIS — L570 Actinic keratosis: Secondary | ICD-10-CM | POA: Diagnosis not present

## 2021-07-24 DIAGNOSIS — R399 Unspecified symptoms and signs involving the genitourinary system: Secondary | ICD-10-CM | POA: Diagnosis not present

## 2021-07-31 DIAGNOSIS — N401 Enlarged prostate with lower urinary tract symptoms: Secondary | ICD-10-CM | POA: Diagnosis not present

## 2021-07-31 DIAGNOSIS — R300 Dysuria: Secondary | ICD-10-CM | POA: Diagnosis not present

## 2021-07-31 DIAGNOSIS — R3914 Feeling of incomplete bladder emptying: Secondary | ICD-10-CM | POA: Diagnosis not present

## 2021-08-08 DIAGNOSIS — E7800 Pure hypercholesterolemia, unspecified: Secondary | ICD-10-CM | POA: Diagnosis not present

## 2021-08-08 DIAGNOSIS — Z79899 Other long term (current) drug therapy: Secondary | ICD-10-CM | POA: Diagnosis not present

## 2021-08-08 DIAGNOSIS — Z1331 Encounter for screening for depression: Secondary | ICD-10-CM | POA: Diagnosis not present

## 2021-08-08 DIAGNOSIS — D649 Anemia, unspecified: Secondary | ICD-10-CM | POA: Diagnosis not present

## 2021-08-08 DIAGNOSIS — Z0000 Encounter for general adult medical examination without abnormal findings: Secondary | ICD-10-CM | POA: Diagnosis not present

## 2021-08-31 ENCOUNTER — Ambulatory Visit: Payer: Self-pay

## 2021-08-31 ENCOUNTER — Ambulatory Visit (INDEPENDENT_AMBULATORY_CARE_PROVIDER_SITE_OTHER): Payer: Medicare Other | Admitting: Sports Medicine

## 2021-08-31 VITALS — BP 126/60 | Ht 68.0 in | Wt 150.0 lb

## 2021-08-31 DIAGNOSIS — M79642 Pain in left hand: Secondary | ICD-10-CM

## 2021-08-31 NOTE — Patient Instructions (Signed)
[facility] [address] [place] 8.17.23 @ [phone] Arrival time is 145p [phone]

## 2021-08-31 NOTE — Progress Notes (Addendum)
PCP: Lajean Manes, MD  Subjective:   HPI: Patient is a 76 y.o. male here for left hand injury.  1 week ago, the patient was picking up his 36 month old grandchild in a highchair when the chair started to separate causing him to try to grab the chair to keep the child from falling. He is unsure if the back of his hand hit anything in the incident. He had swelling over the back of his hand at the base of his middle finger and noticed that his tendon seemed to be popping over the knuckle as he made a fist. He tried to immobilize his finger by buddy taping a few fingers together for 3 days after his injury. He has been using ice and ibuprofen with some relief. He feels as though his pain is improving over the course of the week.    Past Medical History:  Diagnosis Date   Cancer ([place])    basal cell on nose removed   Diverticulosis    Encounter for loop recorder check 08/04/2018   Hyperlipidemia    Loop recorder in situ: Biotroik 06/20/18 for cryptogenic stroke.  06/20/2018   Scheduled Remote loop recorder check 07/30/2018: There were 0 symptomatic patient activations recorded. No mode switches. NSR. No A. Fib.   TIA (transient ischemic attack) 04/08/2018   Tubular adenoma of colon 2016   Vascular occlusion    retinal vein occlusion    Current Outpatient Medications on File Prior to Visit  Medication Sig Dispense Refill   aspirin 81 MG tablet Take 81 mg by mouth daily.     atorvastatin (LIPITOR) 40 MG tablet Take 1 tablet (40 mg total) by mouth daily. 30 tablet 0   Cholecalciferol (VITAMIN D3) 25 MCG (1000 UT) CAPS Take 1,000 Units by mouth daily.     Coenzyme Q10 (CO Q-10) 100 MG CAPS Take by mouth daily.     Misc Natural Products (PROSTATE SUPPORT PO) Take by mouth daily. Easy flow     Multiple Vitamin (MULTIVITAMIN) tablet Take 1 tablet by mouth daily.     OVER THE COUNTER MEDICATION Take 1 tablet by mouth daily. Prostate plus health complex.     No current facility-administered  medications on file prior to visit.    Past Surgical History:  Procedure Laterality Date   COLONOSCOPY  04/30/2014   LOOP RECORDER IMPLANT  06/20/2018   Biotronik Loop Recorder. Serial # [id number]  for cryptogenic stroke    POLYPECTOMY     TONSILLECTOMY      Allergies  Allergen Reactions   Sulfa Antibiotics     Unknown.  "Mother told me not to take"    BP 126/60   Ht '5\' 8"'$  (1.727 m)   Wt 150 lb (68 kg)   BMI 22.81 kg/m      10/19/2020    2:01 PM 12/01/2020    9:18 AM 12/22/2020    9:19 AM  [place] Adult Exercise  Frequency of aerobic exercise (# of days/week) '5 5 5  '$ Average time in minutes 40 40 40  Frequency of strengthening activities (# of days/week) '2 2 2        '$ No data to display              Objective:  Physical Exam:  Gen: NAD, comfortable in exam room  Hand, left: Swelling and erythema over the dorsal aspect of the 3rd MCP joint. Full flexion and extension of the fingers. Pain with extension of the 3rd finger.  Palpation is normal over the metacarpals and digits. Sensation intact to light touch. Unable to visualize tendon movement over the 3rd MCP due to swelling.   Limited US of the left hand showed splitting tear of the 3rd extensor tendon at the level of the MCP and subluxation of the extensor tendon with active flexion and extension.  Assessment & Plan:  1. Left 3rd extensor tendon tear with subluxation on ultrasound findings. Recommend immobilization, ice, ibuprofen. Referring to hand specialist for further evaluation and treatment.    Virgel Manifold, MS4  Patient seen and evaluated with the medical student.  I agree with the above plan of care.  Although there is no visible subluxation of the extensor tendon on exam, ultrasound does seem to show subluxation as well as a possible split tear of the tendon.  I recommended continued immobilization and consultation with Dr. Grandville Silos at [facility].  I will defer further workup  and treatment to the discretion of Dr. Grandville Silos and the patient will follow-up with me as needed.

## 2021-09-07 DIAGNOSIS — S63613A Unspecified sprain of left middle finger, initial encounter: Secondary | ICD-10-CM | POA: Diagnosis not present

## 2021-09-11 DIAGNOSIS — M25642 Stiffness of left hand, not elsewhere classified: Secondary | ICD-10-CM | POA: Diagnosis not present

## 2021-09-11 DIAGNOSIS — S66303A Unspecified injury of extensor muscle, fascia and tendon of left middle finger at wrist and hand level, initial encounter: Secondary | ICD-10-CM | POA: Diagnosis not present

## 2021-09-20 DIAGNOSIS — M79674 Pain in right toe(s): Secondary | ICD-10-CM | POA: Diagnosis not present

## 2021-09-20 DIAGNOSIS — R3914 Feeling of incomplete bladder emptying: Secondary | ICD-10-CM | POA: Diagnosis not present

## 2021-09-20 DIAGNOSIS — N401 Enlarged prostate with lower urinary tract symptoms: Secondary | ICD-10-CM | POA: Diagnosis not present

## 2021-09-28 DIAGNOSIS — L089 Local infection of the skin and subcutaneous tissue, unspecified: Secondary | ICD-10-CM | POA: Diagnosis not present

## 2021-10-12 DIAGNOSIS — S66812A Strain of other specified muscles, fascia and tendons at wrist and hand level, left hand, initial encounter: Secondary | ICD-10-CM | POA: Diagnosis not present

## 2021-10-16 DIAGNOSIS — Z23 Encounter for immunization: Secondary | ICD-10-CM | POA: Diagnosis not present

## 2021-11-14 DIAGNOSIS — S66812A Strain of other specified muscles, fascia and tendons at wrist and hand level, left hand, initial encounter: Secondary | ICD-10-CM | POA: Diagnosis not present

## 2021-11-22 DIAGNOSIS — K4090 Unilateral inguinal hernia, without obstruction or gangrene, not specified as recurrent: Secondary | ICD-10-CM | POA: Diagnosis not present

## 2021-11-22 DIAGNOSIS — R1032 Left lower quadrant pain: Secondary | ICD-10-CM | POA: Diagnosis not present

## 2022-01-03 IMAGING — CR DG SHOULDER 2+V*R*
3 series · 3 of 3 positions shown · non-contrast
Comparison: None.

CLINICAL DATA: Chronic right shoulder pain.

EXAM:
RIGHT SHOULDER - 2+ VIEW

[w shoulder ap internal righ]
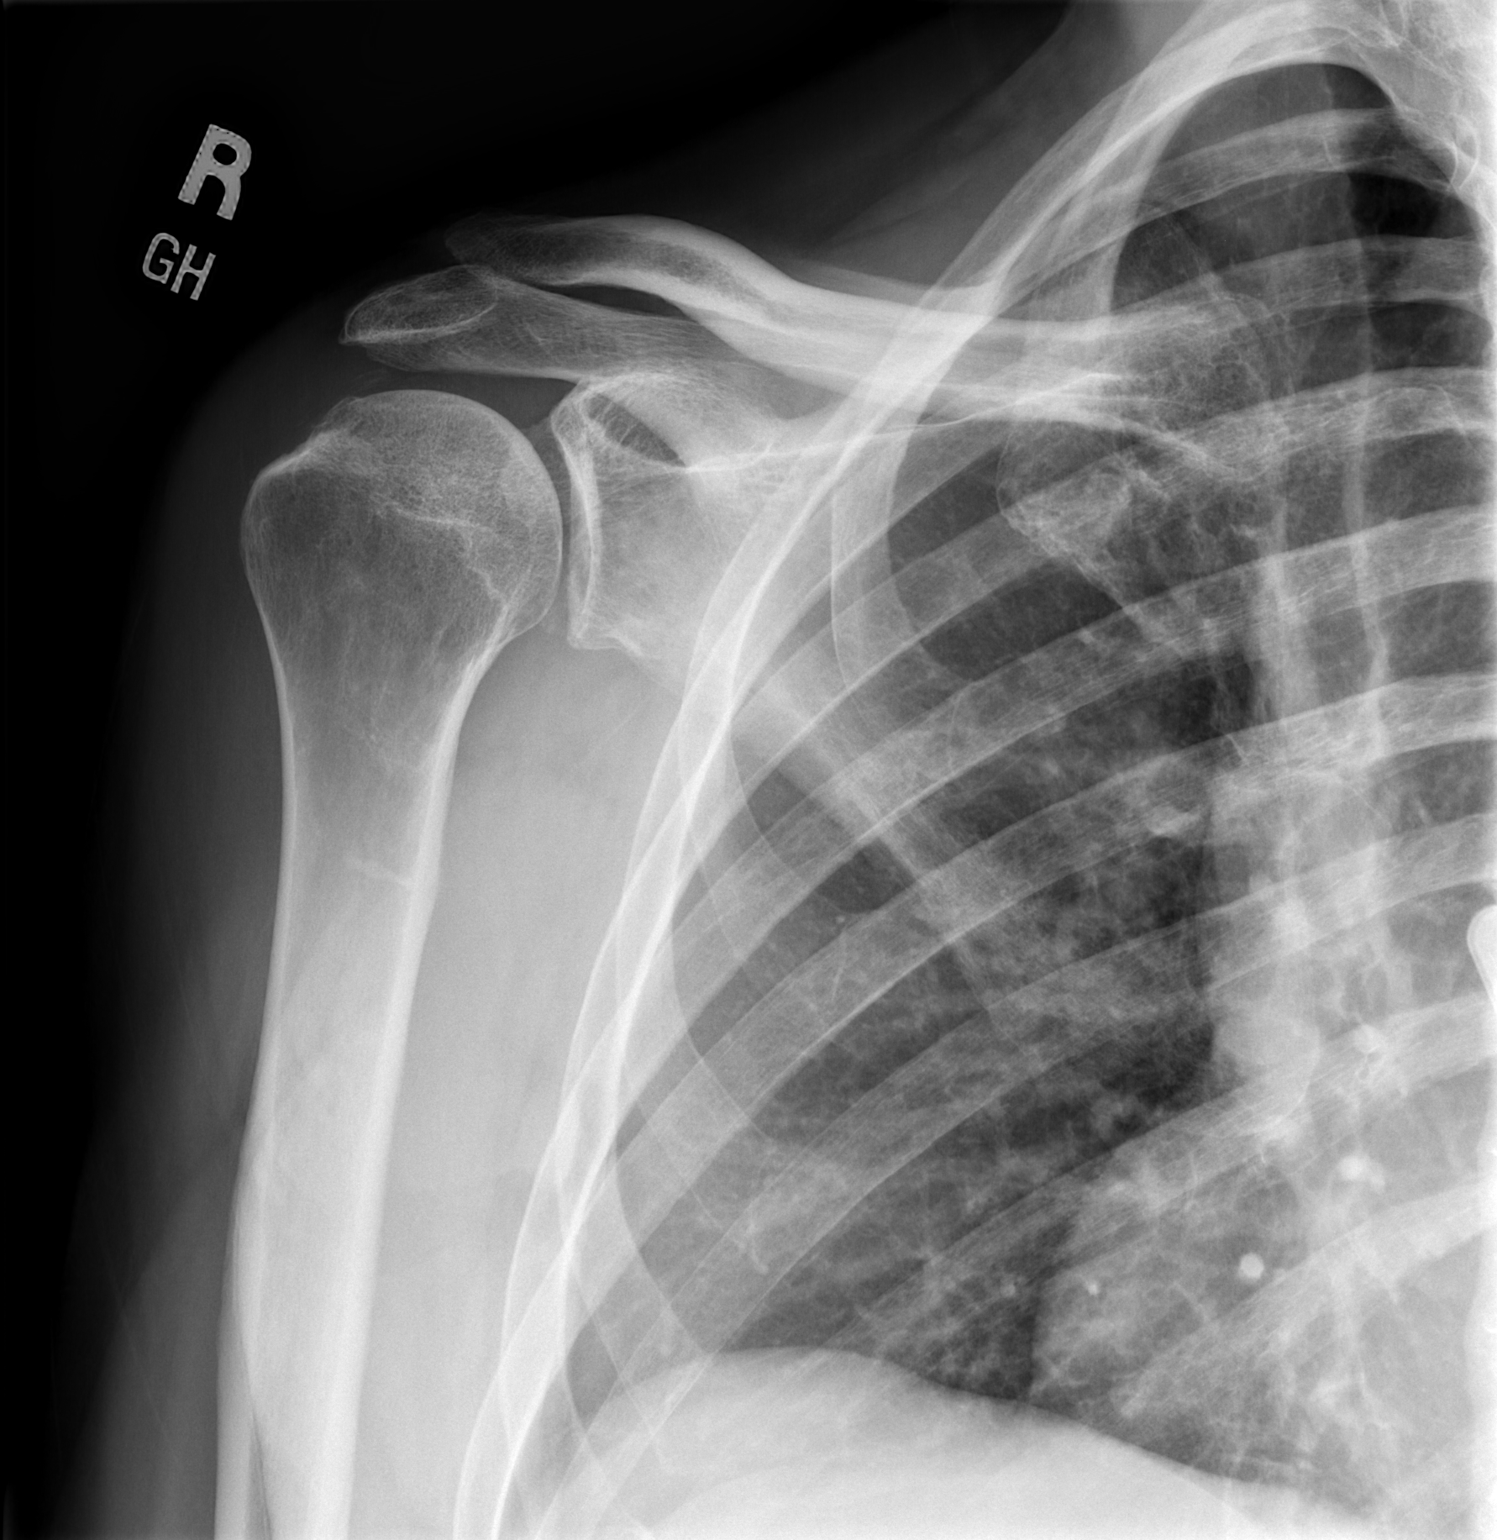

[w shoulder y view right]
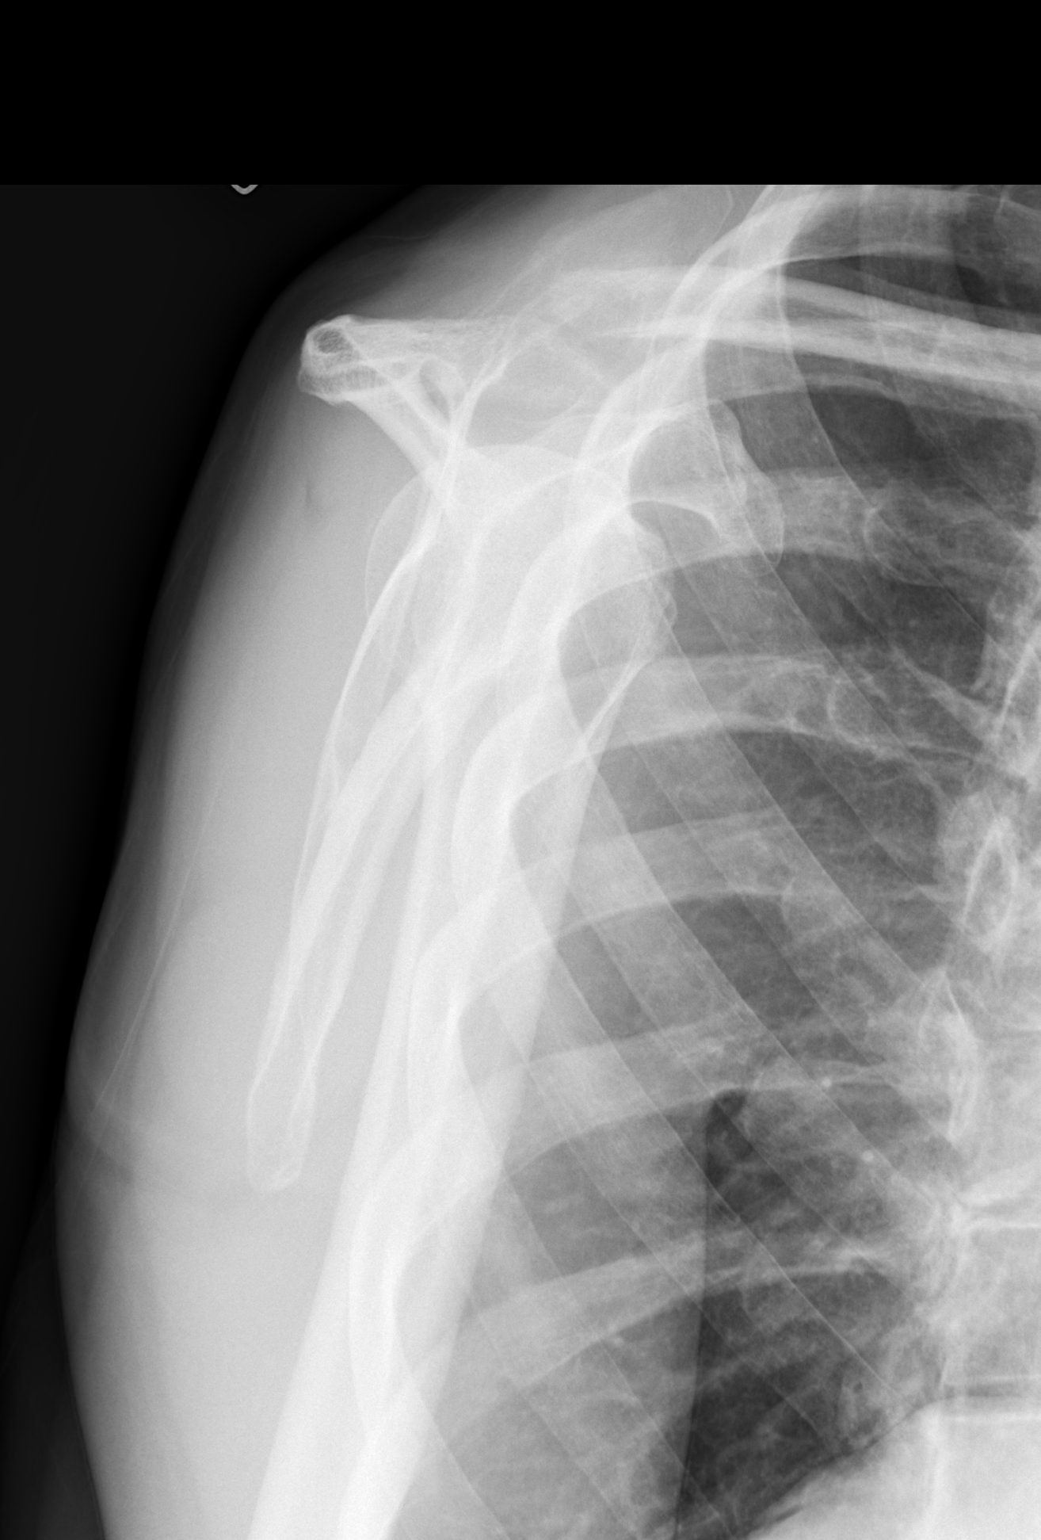

[w shoulder axillary right *]
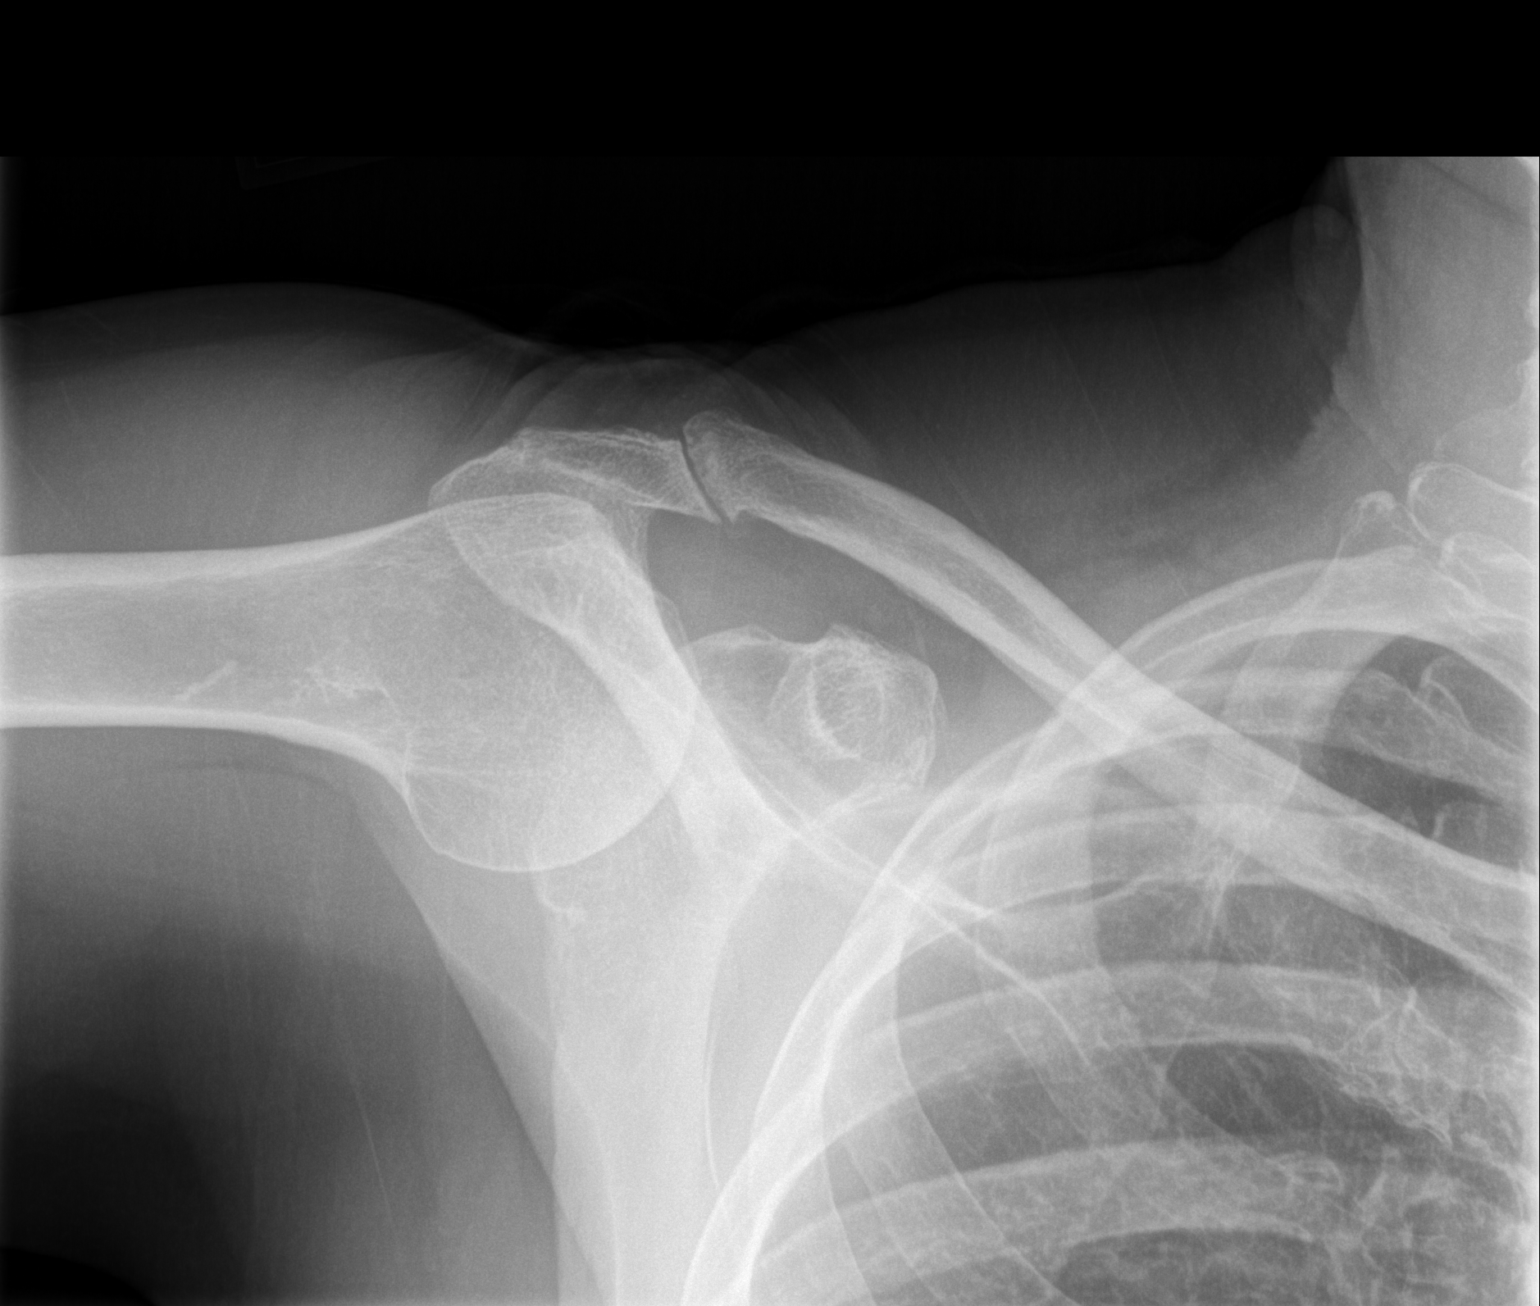

[3 of 3 positions shown; findings below may reference images not displayed]

FINDINGS: Mild degenerative change of the AC joint and glenohumeral joints. No
fracture or dislocation. No focal abnormality.
IMPRESSION: 1. No acute findings.
2. Mild degenerative changes.

## 2022-01-09 ENCOUNTER — Ambulatory Visit (INDEPENDENT_AMBULATORY_CARE_PROVIDER_SITE_OTHER): Payer: Medicare Other | Admitting: Sports Medicine

## 2022-01-09 VITALS — BP 138/78 | Ht 68.0 in | Wt 150.0 lb

## 2022-01-09 DIAGNOSIS — M25561 Pain in right knee: Secondary | ICD-10-CM | POA: Diagnosis not present

## 2022-01-09 MED ORDER — MELOXICAM 15 MG PO TABS: ORAL_TABLET | ORAL | 2 refills | Status: AC

## 2022-01-09 NOTE — Progress Notes (Signed)
   Subjective:    Patient ID: Joseph Pitts, male    DOB: 04/17/1945, 76 y.o.   MRN: [id number]  HPI chief complaint: Right knee pain  Al presents today with 5 to 6 days of medial right knee pain.  He denies any trauma.  He does have a history of a degenerative meniscal tear diagnosed via ultrasound in 2018.  He was treated conservatively with a home exercise program and did well.  Ever since then, he has noticed occasional pinching type sensations in the medial knee.  They usually occur every 3 to 4 months and lasts only a day or so.  However, his most recent episode has lingered for 5 to 6 days.  His pain is intermittent but associated with pinching and catching.  No swelling.  He does describe some instability however.  He has tried some intermittent doses of ibuprofen without much relief.    Review of Systems As above    Objective:   Physical Exam  Well-developed, well-nourished.  No acute distress  Right knee: Good range of motion.  No effusion.  There is tenderness to palpation along the medial joint line with a negative McMurray's.  Equivocal Thessaly's.  No tenderness along the lateral joint line.  Knee is grossly stable ligamentous exam.  Neurovascularly intact distally.      Assessment & Plan:   Right knee pain secondary to DJD versus degenerative meniscal tear  I would like to get some x-rays of the right knee to evaluate the degree of osteoarthritis present.  He will also start a home exercise program consisting of isometric quad and hip abductor strengthening exercises.  I also discussed oral anti-inflammatories versus cortisone injections.  He would like to start with oral anti-inflammatories first.  I will prescribe meloxicam 15 mg daily for 5 days then as needed.  He will follow-up with me again in 3 to 4 weeks for reevaluation.  I will message him through [facility] with his x-ray results when available.  This note was dictated using Dragon naturally speaking software and  may contain errors in syntax, spelling, or content which have not been identified prior to signing this note.

## 2022-01-10 ENCOUNTER — Ambulatory Visit
Admission: RE | Admit: 2022-01-10 | Discharge: 2022-01-10 | Disposition: A | Discharge status: Home / Self Care | Payer: No Typology Code available for payment source | Source: Ambulatory Visit | Attending: Sports Medicine | Admitting: Sports Medicine

## 2022-01-10 DIAGNOSIS — M25561 Pain in right knee: Secondary | ICD-10-CM | POA: Diagnosis not present

## 2022-01-24 ENCOUNTER — Encounter: Payer: Self-pay | Admitting: Sports Medicine

## 2022-02-06 ENCOUNTER — Ambulatory Visit (INDEPENDENT_AMBULATORY_CARE_PROVIDER_SITE_OTHER): Payer: Medicare Other | Admitting: Sports Medicine

## 2022-02-06 VITALS — BP 120/82 | Ht 68.0 in | Wt 150.0 lb

## 2022-02-06 DIAGNOSIS — M1711 Unilateral primary osteoarthritis, right knee: Secondary | ICD-10-CM

## 2022-02-06 NOTE — Progress Notes (Signed)
Patient ID: Joseph Pitts, male   DOB: Dec 20, 1945, 77 y.o.   MRN: [id number]  [name] presents today for follow-up on right knee pain.  His symptoms have improved.  He did notice some benefit from the meloxicam.  He also uses a knee sleeve.  Although he does still endorse some occasional pinching and catching in the knee, it is less than it was upon his presentation last month.  I reviewed x-rays with him in the room today.  His x-rays show only mild arthritic change as well as some chondrocalcinosis both in the medial and lateral compartments.  I do believe it is the chondrocalcinosis in the medial compartment that he feels when he experiences his episodes of pinching.  However, my recommendation at this point in time is to not pursue further workup or treatment.  [name] is very active and very fit and his symptoms do not appear to be interfering with his daily activities right now.  He understands the importance of maintaining good strength in his hips and knees.  No limitation on activity.  Follow-up as needed.  This note was dictated using Dragon naturally speaking software and may contain errors in syntax, spelling, or content which have not been identified prior to signing this note.

## 2022-03-14 DIAGNOSIS — L821 Other seborrheic keratosis: Secondary | ICD-10-CM | POA: Diagnosis not present

## 2022-03-14 DIAGNOSIS — Z85828 Personal history of other malignant neoplasm of skin: Secondary | ICD-10-CM | POA: Diagnosis not present

## 2022-03-14 DIAGNOSIS — L570 Actinic keratosis: Secondary | ICD-10-CM | POA: Diagnosis not present

## 2022-03-14 DIAGNOSIS — D225 Melanocytic nevi of trunk: Secondary | ICD-10-CM | POA: Diagnosis not present

## 2022-04-12 DIAGNOSIS — R3914 Feeling of incomplete bladder emptying: Secondary | ICD-10-CM | POA: Diagnosis not present

## 2022-04-12 DIAGNOSIS — N401 Enlarged prostate with lower urinary tract symptoms: Secondary | ICD-10-CM | POA: Diagnosis not present

## 2022-04-17 DIAGNOSIS — H1045 Other chronic allergic conjunctivitis: Secondary | ICD-10-CM | POA: Diagnosis not present

## 2022-04-17 DIAGNOSIS — H35352 Cystoid macular degeneration, left eye: Secondary | ICD-10-CM | POA: Diagnosis not present

## 2022-04-17 DIAGNOSIS — H2513 Age-related nuclear cataract, bilateral: Secondary | ICD-10-CM | POA: Diagnosis not present

## 2022-04-17 DIAGNOSIS — H0102A Squamous blepharitis right eye, upper and lower eyelids: Secondary | ICD-10-CM | POA: Diagnosis not present

## 2022-04-17 DIAGNOSIS — H11823 Conjunctivochalasis, bilateral: Secondary | ICD-10-CM | POA: Diagnosis not present

## 2022-04-17 DIAGNOSIS — H04123 Dry eye syndrome of bilateral lacrimal glands: Secondary | ICD-10-CM | POA: Diagnosis not present

## 2022-04-17 DIAGNOSIS — H348322 Tributary (branch) retinal vein occlusion, left eye, stable: Secondary | ICD-10-CM | POA: Diagnosis not present

## 2022-04-17 DIAGNOSIS — G43109 Migraine with aura, not intractable, without status migrainosus: Secondary | ICD-10-CM | POA: Diagnosis not present

## 2022-04-17 DIAGNOSIS — H0102B Squamous blepharitis left eye, upper and lower eyelids: Secondary | ICD-10-CM | POA: Diagnosis not present

## 2022-04-24 ENCOUNTER — Ambulatory Visit: Payer: Self-pay

## 2022-04-24 ENCOUNTER — Ambulatory Visit (INDEPENDENT_AMBULATORY_CARE_PROVIDER_SITE_OTHER): Payer: Medicare Other | Admitting: Sports Medicine

## 2022-04-24 VITALS — BP 126/82 | Ht 68.0 in | Wt 150.0 lb

## 2022-04-24 DIAGNOSIS — M25562 Pain in left knee: Secondary | ICD-10-CM

## 2022-04-24 DIAGNOSIS — M25461 Effusion, right knee: Secondary | ICD-10-CM

## 2022-04-24 DIAGNOSIS — M25561 Pain in right knee: Secondary | ICD-10-CM

## 2022-04-24 DIAGNOSIS — M25462 Effusion, left knee: Secondary | ICD-10-CM | POA: Diagnosis not present

## 2022-04-24 NOTE — Progress Notes (Signed)
Established Patient Office Visit  Subjective   Patient ID: Joseph Pitts, male    DOB: 1945-05-09  Age: 77 y.o. MRN: [id number]  Bilateral knee fullness.  Joseph Pitts is here today with chief complaint of bilateral knee fullness and some decreased range of motion since Friday.  He reports he woke up Friday and was having a sensation of fullness and decreased range of motion.  He reports he was not having any mechanical blockage however does decreased range of motion secondary to some feeling of fullness.  The day prior he was out about with his 13-year-old grandson.  He also reports he was sick for about 2 weeks prior to this incident.  He denies any acute trauma or injury to his knees.  His discomfort has improved about 90% since Friday.  He took previously prescribed meloxicam that gave him some temporary relief.  He is also been using some heat on his knees.  He denies any instability.   ROS as listed above in HPI    Objective:     BP 126/82   Ht 5\' 8"  (1.727 m)   Wt 150 lb (68 kg)   BMI 22.81 kg/m   Physical Exam Vitals reviewed.  Constitutional:      General: He is not in acute distress.    Appearance: Normal appearance. He is normal weight. He is not ill-appearing, toxic-appearing or diaphoretic.  Pulmonary:     Effort: Pulmonary effort is normal.  Skin:    General: Skin is warm.  Neurological:     Mental Status: He is alert.   Right knee: No obvious deformity or asymmetry.  No ecchymosis.  1+ effusion slight warmth to the touch over the superior patella.  Full extension from 0 flexion to about 110 degrees.  No tenderness to palpation along the medial or lateral joint line.  Stable to varus and valgus stress. Left knee: No obvious deformity or asymmetry.  No ecchymosis.  1+ effusion.  Full extension from 0 with flexion to about 120 degrees.  No tenderness to palpation along medial or lateral joint line.  Stable to varus and valgus stress.  Normal gait.  Limited  ultrasound Right knee: There is a small amount hypoechoic fluid seen in the suprapatellar pouch, quadriceps tendon appears intact without any hypoechoic changes.  Medial meniscus is visualized, calcific changes within the meniscus and decreased joint space seen.  Meniscal pseudocyst appreciated.  Lateral meniscus is visualized small calcific changes within the meniscus, preserved joint line space. Left knee: Small amount of hypoechoic fluid seen in the suprapatellar pouch.  Quadriceps tendon visualized, intact without any hypoechoic changes.  Medial and lateral meniscus were visualized.  Some small hyperechoic, calcific changes seen within the menisci.  Impression: Bilateral chondrocalcinosis with mild to moderate joint effusions seen. Ultrasound and interpretation by Wolfgang Phoenix. Oneida Alar, MD  and Doyne Keel. Rhian Asebedo. DO    Assessment & Plan:   Problem List Items Addressed This Visit       Musculoskeletal and Integument   Bilateral knee effusions - Primary    Patient has clinical and radiographical evidence, ultrasound, of bilateral osteoarthritis.  We discussed with him measures are fairly conservative including compression sleeve, ice and oral medications, meloxicam and corticosteroid injection.  As his pain has improved already with conservative measures he would like to continue with that.  Recommend he use meloxicam for the next 5 days and then again as needed.  Recommend compression sleeve when he is up and about and doing  physical activity.  Should his pain return prior to his trip and he will be doing a lot of hiking in [place] he may come back to the clinic for corticosteroid injection.  He verbalized understanding.  He may return to physical activity as tolerated.  We have sent an order for ice circulation machine.  Follow-up as needed.       Return if symptoms worsen or fail to improve.    Elmore Guise, DO  I observed and examined the patient with the [facility] resident and agree with  assessment and plan.  Note reviewed and modified by me. Ila Mcgill, MD

## 2022-04-24 NOTE — Assessment & Plan Note (Signed)
Patient has clinical and radiographical evidence, ultrasound, of bilateral osteoarthritis.  We discussed with him measures are fairly conservative including compression sleeve, ice and oral medications, meloxicam and corticosteroid injection.  As his pain has improved already with conservative measures he would like to continue with that.  Recommend he use meloxicam for the next 5 days and then again as needed.  Recommend compression sleeve when he is up and about and doing physical activity.  Should his pain return prior to his trip and he will be doing a lot of hiking in [place] he may come back to the clinic for corticosteroid injection.  He verbalized understanding.  He may return to physical activity as tolerated.  We have sent an order for ice circulation machine.  Follow-up as needed.

## 2022-05-07 ENCOUNTER — Encounter: Payer: Self-pay | Admitting: *Deleted

## 2022-08-15 DIAGNOSIS — N400 Enlarged prostate without lower urinary tract symptoms: Secondary | ICD-10-CM | POA: Diagnosis not present

## 2022-08-15 DIAGNOSIS — Z0000 Encounter for general adult medical examination without abnormal findings: Secondary | ICD-10-CM | POA: Diagnosis not present

## 2022-08-15 DIAGNOSIS — Z79899 Other long term (current) drug therapy: Secondary | ICD-10-CM | POA: Diagnosis not present

## 2022-08-15 DIAGNOSIS — Z1331 Encounter for screening for depression: Secondary | ICD-10-CM | POA: Diagnosis not present

## 2022-08-15 DIAGNOSIS — E7800 Pure hypercholesterolemia, unspecified: Secondary | ICD-10-CM | POA: Diagnosis not present

## 2022-08-15 DIAGNOSIS — D508 Other iron deficiency anemias: Secondary | ICD-10-CM | POA: Diagnosis not present

## 2022-09-06 DIAGNOSIS — M8588 Other specified disorders of bone density and structure, other site: Secondary | ICD-10-CM | POA: Diagnosis not present

## 2022-09-14 DIAGNOSIS — M8589 Other specified disorders of bone density and structure, multiple sites: Secondary | ICD-10-CM | POA: Diagnosis not present

## 2022-09-28 DIAGNOSIS — Z23 Encounter for immunization: Secondary | ICD-10-CM | POA: Diagnosis not present

## 2022-10-10 DIAGNOSIS — R3914 Feeling of incomplete bladder emptying: Secondary | ICD-10-CM | POA: Diagnosis not present

## 2022-10-10 DIAGNOSIS — N401 Enlarged prostate with lower urinary tract symptoms: Secondary | ICD-10-CM | POA: Diagnosis not present

## 2022-10-10 DIAGNOSIS — N403 Nodular prostate with lower urinary tract symptoms: Secondary | ICD-10-CM | POA: Diagnosis not present

## 2022-10-17 DIAGNOSIS — Z23 Encounter for immunization: Secondary | ICD-10-CM | POA: Diagnosis not present

## 2022-10-31 ENCOUNTER — Encounter: Payer: Self-pay | Admitting: *Deleted

## 2022-10-31 ENCOUNTER — Ambulatory Visit (INDEPENDENT_AMBULATORY_CARE_PROVIDER_SITE_OTHER): Payer: No Typology Code available for payment source | Admitting: Family Medicine

## 2022-10-31 ENCOUNTER — Encounter: Payer: Self-pay | Admitting: Family Medicine

## 2022-10-31 ENCOUNTER — Other Ambulatory Visit: Payer: Self-pay

## 2022-10-31 VITALS — BP 114/70 | Ht 68.0 in | Wt 155.0 lb

## 2022-10-31 DIAGNOSIS — M25511 Pain in right shoulder: Secondary | ICD-10-CM

## 2022-10-31 DIAGNOSIS — S46801A Unspecified injury of other muscles, fascia and tendons at shoulder and upper arm level, right arm, initial encounter: Secondary | ICD-10-CM

## 2022-10-31 DIAGNOSIS — S46811A Strain of other muscles, fascia and tendons at shoulder and upper arm level, right arm, initial encounter: Secondary | ICD-10-CM

## 2022-10-31 NOTE — Assessment & Plan Note (Signed)
-   Patient had traumatic fall backwards on the stairs onto straight an outstretched hand. - Ultrasound findings consistent with complete rupture of supraspinatus, partial involvement of subscapularis, and infraspinatus - We will send referral to [facility] for possible surgical repair given his overall level of health being good and high activity level and a dominant arm. - Sling given for temporary pain relief.  Patient instructed to move the elbow when at home and resting. - Ice frequently and use naproxen or ibuprofen for pain.  Patient states his wife is a Teacher, early years/pre and can facilitate this.

## 2022-10-31 NOTE — Progress Notes (Unsigned)
Joseph Pitts - 77 y.o. male MRN [id number]  Date of birth: 03-03-1945  PCP: Merlene Laughter, MD (Inactive)  Subjective:  No chief complaint on file. Right shoulder pain  HPI: Past Medical, Surgical, Social, and Family History Reviewed & Updated per EMR.   Patient is a 77 y.o. male here for pain located over the lateral right shoulder that started last night after he slipped backwards onto the stairs and tried to catch himself with his arm extended behind himself.  He felt immediate pain with decreased range of motion.. It is exacerbated by flexion and abduction and alleviated by resting in a sling manner.  Ibuprofen has helped some with the pain.  He denies any numbness, weakness, fever, chills, or swelling of the joint.  He is very active lifting weights 2-4 times a week at the gym with aerobic exercise as well.  He is right arm dominant.   Past Surgical History:  Procedure Laterality Date   COLONOSCOPY  04/30/2014   LOOP RECORDER IMPLANT  06/20/2018   Biotronik Loop Recorder. Serial # [id number]  for cryptogenic stroke    POLYPECTOMY     TONSILLECTOMY      Allergies  Allergen Reactions   Sulfa Antibiotics     Unknown.  "Mother told me not to take"        Objective:  Physical Exam: VS: BP:114/70  HR: bpm  TEMP: ( )  RESP:   HT:5\' 8"  (172.7 cm)   WT:155 lb (70.3 kg)  BMI:23.57  Gen: NAD, speaks clearly, comfortable in exam room Respiratory: normal work of breathing on room air Skin: No rashes, abrasions, or ecchymosis MSK:  Shoulder: Inspection no obvious deformity ROM: Full internal rotation, flexion limited to 30 degrees due to pain, abduction limited to 90 degrees due to pain Strength: 3/5 in flexion, 4/5 in abduction, biceps flexion is 5/5 AC joint: Nontender Special tests: Speeds, empty can, Delsa Bern, positive Lift off, crossover, and Neer's negative  Ultrasound of right shoulder:  Biceps Tendon SAX and LAX: visualized in bicipital groove w/  surrounding but no intratendinous hypoechoic changes, pectoralis and subscapularis insertions in tact, IR/ER does not demonstrate popping of the tendon out of the groove Subscapularis tendon - viewed in SAX and LAX inserting into the inferior lesser tubercle of humerus. echogenics: hypoechoic changes within the tendon confirmed in both views, but fibers intact AC joint -no separation, w/ superior osteophyte formation, giser sign positive Supraspinatus tendon - viewed in SAX and LAX, tendon in tact, insertion at superior facet of greater tubercle of the humerus w/ echogenics: Sparse fibers intact but large hypoechogenic area with posterior bunching of fibers suggestive of complete rupture ,  Infraspinatus and Teres minor tendons - viewed in LAX and SAX at the middle facet of the greater tubercle of humerus w/ echogenics: hypoechoic changes within the tendon confirmed in both views Posterior glenohumeral joint visualized with small effusion present  Summary: Complete rupture of supraspinatus, partial rupture of infraspinatus and subscapularis, no humeral effusion present indicating possible labral tear  Ultrasound and interpretation by Dr. Webb Silversmith and Dr. Christella Hartigan    Assessment & Plan:   Traumatic tear of supraspinatus tendon of right shoulder - Patient had traumatic fall backwards on the stairs onto straight an outstretched hand. - Ultrasound findings consistent with complete rupture of supraspinatus, partial involvement of subscapularis, and infraspinatus - We will send referral to [facility] for possible surgical repair given his overall level of health being good and high activity level and a dominant  arm. - Sling given for temporary pain relief.  Patient instructed to move the elbow when at home and resting. - Ice frequently and use naproxen or ibuprofen for pain.  Patient states his wife is a Teacher, early years/pre and can facilitate this.   Traumatic injury of infraspinatus tendon, right, initial  encounter -Partial tear of the infraspinatus seen on ultrasound but is intact. - We will have him follow-up with orthopedics for possible repair of the rotator cuff. - Ultrasound also concerning for possible labral involvement.  Partial tear of subscapularis tendon, right, initial encounter - Partial tear suspected on ultrasound findings. - Ice, anti-inflammatories, and orthopedic follow-up.    Rica Mote MD [facility] Sports [facility] Fellow   Addendum:  Patient seen & examined in the office with fellow.  History, exam, plan of care were precepted with me.  MSK U/S findings reviewed and agree with findings as noted above.  Agree with fellow note as above.  Darene Lamer, DO, CAQSM

## 2022-10-31 NOTE — Assessment & Plan Note (Signed)
-  Partial tear of the infraspinatus seen on ultrasound but is intact. - We will have him follow-up with orthopedics for possible repair of the rotator cuff. - Ultrasound also concerning for possible labral involvement.

## 2022-10-31 NOTE — Assessment & Plan Note (Signed)
-   Partial tear suspected on ultrasound findings. - Ice, anti-inflammatories, and orthopedic follow-up.

## 2022-11-01 DIAGNOSIS — M75121 Complete rotator cuff tear or rupture of right shoulder, not specified as traumatic: Secondary | ICD-10-CM | POA: Diagnosis not present

## 2022-11-03 DIAGNOSIS — M25511 Pain in right shoulder: Secondary | ICD-10-CM | POA: Diagnosis not present

## 2022-11-07 DIAGNOSIS — M25511 Pain in right shoulder: Secondary | ICD-10-CM | POA: Diagnosis not present

## 2022-11-07 DIAGNOSIS — M75121 Complete rotator cuff tear or rupture of right shoulder, not specified as traumatic: Secondary | ICD-10-CM | POA: Diagnosis not present

## 2022-11-15 DIAGNOSIS — R059 Cough, unspecified: Secondary | ICD-10-CM | POA: Diagnosis not present

## 2022-11-15 DIAGNOSIS — J020 Streptococcal pharyngitis: Secondary | ICD-10-CM | POA: Diagnosis not present

## 2022-11-15 DIAGNOSIS — U071 COVID-19: Secondary | ICD-10-CM | POA: Diagnosis not present

## 2022-11-15 DIAGNOSIS — J039 Acute tonsillitis, unspecified: Secondary | ICD-10-CM | POA: Diagnosis not present

## 2022-11-15 DIAGNOSIS — R0982 Postnasal drip: Secondary | ICD-10-CM | POA: Diagnosis not present

## 2022-11-15 DIAGNOSIS — J36 Peritonsillar abscess: Secondary | ICD-10-CM | POA: Diagnosis not present

## 2022-11-19 ENCOUNTER — Other Ambulatory Visit: Payer: Self-pay | Admitting: Medical Genetics

## 2022-11-19 DIAGNOSIS — Z006 Encounter for examination for normal comparison and control in clinical research program: Secondary | ICD-10-CM

## 2022-11-26 DIAGNOSIS — J019 Acute sinusitis, unspecified: Secondary | ICD-10-CM | POA: Diagnosis not present

## 2022-11-26 DIAGNOSIS — J029 Acute pharyngitis, unspecified: Secondary | ICD-10-CM | POA: Diagnosis not present

## 2022-11-26 DIAGNOSIS — Z03818 Encounter for observation for suspected exposure to other biological agents ruled out: Secondary | ICD-10-CM | POA: Diagnosis not present

## 2022-11-28 DIAGNOSIS — J309 Allergic rhinitis, unspecified: Secondary | ICD-10-CM | POA: Diagnosis not present

## 2022-12-04 DIAGNOSIS — R0981 Nasal congestion: Secondary | ICD-10-CM | POA: Diagnosis not present

## 2022-12-04 DIAGNOSIS — J0100 Acute maxillary sinusitis, unspecified: Secondary | ICD-10-CM | POA: Diagnosis not present

## 2022-12-25 DIAGNOSIS — G8918 Other acute postprocedural pain: Secondary | ICD-10-CM | POA: Diagnosis not present

## 2022-12-25 DIAGNOSIS — M94211 Chondromalacia, right shoulder: Secondary | ICD-10-CM | POA: Diagnosis not present

## 2022-12-25 DIAGNOSIS — Y999 Unspecified external cause status: Secondary | ICD-10-CM | POA: Diagnosis not present

## 2022-12-25 DIAGNOSIS — M7521 Bicipital tendinitis, right shoulder: Secondary | ICD-10-CM | POA: Diagnosis not present

## 2022-12-25 DIAGNOSIS — X58XXXA Exposure to other specified factors, initial encounter: Secondary | ICD-10-CM | POA: Diagnosis not present

## 2022-12-25 DIAGNOSIS — S43431A Superior glenoid labrum lesion of right shoulder, initial encounter: Secondary | ICD-10-CM | POA: Diagnosis not present

## 2022-12-25 DIAGNOSIS — M7581 Other shoulder lesions, right shoulder: Secondary | ICD-10-CM | POA: Diagnosis not present

## 2022-12-25 DIAGNOSIS — M75121 Complete rotator cuff tear or rupture of right shoulder, not specified as traumatic: Secondary | ICD-10-CM | POA: Diagnosis not present

## 2022-12-25 DIAGNOSIS — M24111 Other articular cartilage disorders, right shoulder: Secondary | ICD-10-CM | POA: Diagnosis not present

## 2022-12-25 DIAGNOSIS — S46011A Strain of muscle(s) and tendon(s) of the rotator cuff of right shoulder, initial encounter: Secondary | ICD-10-CM | POA: Diagnosis not present

## 2023-01-04 DIAGNOSIS — M25511 Pain in right shoulder: Secondary | ICD-10-CM | POA: Diagnosis not present

## 2023-01-04 DIAGNOSIS — R531 Weakness: Secondary | ICD-10-CM | POA: Diagnosis not present

## 2023-01-04 DIAGNOSIS — M75101 Unspecified rotator cuff tear or rupture of right shoulder, not specified as traumatic: Secondary | ICD-10-CM | POA: Diagnosis not present

## 2023-01-07 DIAGNOSIS — M25511 Pain in right shoulder: Secondary | ICD-10-CM | POA: Diagnosis not present

## 2023-01-07 DIAGNOSIS — M75101 Unspecified rotator cuff tear or rupture of right shoulder, not specified as traumatic: Secondary | ICD-10-CM | POA: Diagnosis not present

## 2023-01-07 DIAGNOSIS — R531 Weakness: Secondary | ICD-10-CM | POA: Diagnosis not present

## 2023-01-10 DIAGNOSIS — M25511 Pain in right shoulder: Secondary | ICD-10-CM | POA: Diagnosis not present

## 2023-01-10 DIAGNOSIS — R531 Weakness: Secondary | ICD-10-CM | POA: Diagnosis not present

## 2023-01-10 DIAGNOSIS — M75101 Unspecified rotator cuff tear or rupture of right shoulder, not specified as traumatic: Secondary | ICD-10-CM | POA: Diagnosis not present

## 2023-01-22 DIAGNOSIS — M25511 Pain in right shoulder: Secondary | ICD-10-CM | POA: Diagnosis not present

## 2023-01-22 DIAGNOSIS — R531 Weakness: Secondary | ICD-10-CM | POA: Diagnosis not present

## 2023-01-22 DIAGNOSIS — M75101 Unspecified rotator cuff tear or rupture of right shoulder, not specified as traumatic: Secondary | ICD-10-CM | POA: Diagnosis not present

## 2023-01-30 DIAGNOSIS — R531 Weakness: Secondary | ICD-10-CM | POA: Diagnosis not present

## 2023-01-30 DIAGNOSIS — M75101 Unspecified rotator cuff tear or rupture of right shoulder, not specified as traumatic: Secondary | ICD-10-CM | POA: Diagnosis not present

## 2023-01-30 DIAGNOSIS — M25511 Pain in right shoulder: Secondary | ICD-10-CM | POA: Diagnosis not present

## 2023-02-04 DIAGNOSIS — R531 Weakness: Secondary | ICD-10-CM | POA: Diagnosis not present

## 2023-02-04 DIAGNOSIS — M25511 Pain in right shoulder: Secondary | ICD-10-CM | POA: Diagnosis not present

## 2023-02-04 DIAGNOSIS — M75101 Unspecified rotator cuff tear or rupture of right shoulder, not specified as traumatic: Secondary | ICD-10-CM | POA: Diagnosis not present

## 2023-02-08 DIAGNOSIS — R531 Weakness: Secondary | ICD-10-CM | POA: Diagnosis not present

## 2023-02-08 DIAGNOSIS — M75101 Unspecified rotator cuff tear or rupture of right shoulder, not specified as traumatic: Secondary | ICD-10-CM | POA: Diagnosis not present

## 2023-02-08 DIAGNOSIS — M25511 Pain in right shoulder: Secondary | ICD-10-CM | POA: Diagnosis not present

## 2023-02-13 DIAGNOSIS — M75101 Unspecified rotator cuff tear or rupture of right shoulder, not specified as traumatic: Secondary | ICD-10-CM | POA: Diagnosis not present

## 2023-02-13 DIAGNOSIS — M25511 Pain in right shoulder: Secondary | ICD-10-CM | POA: Diagnosis not present

## 2023-02-13 DIAGNOSIS — R531 Weakness: Secondary | ICD-10-CM | POA: Diagnosis not present

## 2023-02-15 DIAGNOSIS — R531 Weakness: Secondary | ICD-10-CM | POA: Diagnosis not present

## 2023-02-15 DIAGNOSIS — M75101 Unspecified rotator cuff tear or rupture of right shoulder, not specified as traumatic: Secondary | ICD-10-CM | POA: Diagnosis not present

## 2023-02-15 DIAGNOSIS — M25511 Pain in right shoulder: Secondary | ICD-10-CM | POA: Diagnosis not present

## 2023-02-20 DIAGNOSIS — R531 Weakness: Secondary | ICD-10-CM | POA: Diagnosis not present

## 2023-02-20 DIAGNOSIS — M75101 Unspecified rotator cuff tear or rupture of right shoulder, not specified as traumatic: Secondary | ICD-10-CM | POA: Diagnosis not present

## 2023-02-20 DIAGNOSIS — M25511 Pain in right shoulder: Secondary | ICD-10-CM | POA: Diagnosis not present

## 2023-02-23 DIAGNOSIS — M75101 Unspecified rotator cuff tear or rupture of right shoulder, not specified as traumatic: Secondary | ICD-10-CM | POA: Diagnosis not present

## 2023-02-23 DIAGNOSIS — R531 Weakness: Secondary | ICD-10-CM | POA: Diagnosis not present

## 2023-02-23 DIAGNOSIS — M25511 Pain in right shoulder: Secondary | ICD-10-CM | POA: Diagnosis not present

## 2023-02-25 DIAGNOSIS — M25511 Pain in right shoulder: Secondary | ICD-10-CM | POA: Diagnosis not present

## 2023-02-25 DIAGNOSIS — R531 Weakness: Secondary | ICD-10-CM | POA: Diagnosis not present

## 2023-02-25 DIAGNOSIS — M75101 Unspecified rotator cuff tear or rupture of right shoulder, not specified as traumatic: Secondary | ICD-10-CM | POA: Diagnosis not present

## 2023-02-27 DIAGNOSIS — R531 Weakness: Secondary | ICD-10-CM | POA: Diagnosis not present

## 2023-02-27 DIAGNOSIS — M75101 Unspecified rotator cuff tear or rupture of right shoulder, not specified as traumatic: Secondary | ICD-10-CM | POA: Diagnosis not present

## 2023-02-27 DIAGNOSIS — M25511 Pain in right shoulder: Secondary | ICD-10-CM | POA: Diagnosis not present

## 2023-03-04 DIAGNOSIS — M75101 Unspecified rotator cuff tear or rupture of right shoulder, not specified as traumatic: Secondary | ICD-10-CM | POA: Diagnosis not present

## 2023-03-04 DIAGNOSIS — R531 Weakness: Secondary | ICD-10-CM | POA: Diagnosis not present

## 2023-03-04 DIAGNOSIS — M25511 Pain in right shoulder: Secondary | ICD-10-CM | POA: Diagnosis not present

## 2023-03-06 DIAGNOSIS — M75101 Unspecified rotator cuff tear or rupture of right shoulder, not specified as traumatic: Secondary | ICD-10-CM | POA: Diagnosis not present

## 2023-03-06 DIAGNOSIS — R531 Weakness: Secondary | ICD-10-CM | POA: Diagnosis not present

## 2023-03-06 DIAGNOSIS — M25511 Pain in right shoulder: Secondary | ICD-10-CM | POA: Diagnosis not present

## 2023-03-13 DIAGNOSIS — M75101 Unspecified rotator cuff tear or rupture of right shoulder, not specified as traumatic: Secondary | ICD-10-CM | POA: Diagnosis not present

## 2023-03-13 DIAGNOSIS — M25511 Pain in right shoulder: Secondary | ICD-10-CM | POA: Diagnosis not present

## 2023-03-13 DIAGNOSIS — R531 Weakness: Secondary | ICD-10-CM | POA: Diagnosis not present

## 2023-03-20 DIAGNOSIS — R531 Weakness: Secondary | ICD-10-CM | POA: Diagnosis not present

## 2023-03-20 DIAGNOSIS — L570 Actinic keratosis: Secondary | ICD-10-CM | POA: Diagnosis not present

## 2023-03-20 DIAGNOSIS — M25511 Pain in right shoulder: Secondary | ICD-10-CM | POA: Diagnosis not present

## 2023-03-20 DIAGNOSIS — M75101 Unspecified rotator cuff tear or rupture of right shoulder, not specified as traumatic: Secondary | ICD-10-CM | POA: Diagnosis not present

## 2023-03-20 DIAGNOSIS — D2271 Melanocytic nevi of right lower limb, including hip: Secondary | ICD-10-CM | POA: Diagnosis not present

## 2023-03-20 DIAGNOSIS — L111 Transient acantholytic dermatosis [Grover]: Secondary | ICD-10-CM | POA: Diagnosis not present

## 2023-03-20 DIAGNOSIS — Z85828 Personal history of other malignant neoplasm of skin: Secondary | ICD-10-CM | POA: Diagnosis not present

## 2023-03-20 DIAGNOSIS — D225 Melanocytic nevi of trunk: Secondary | ICD-10-CM | POA: Diagnosis not present

## 2023-03-25 DIAGNOSIS — R531 Weakness: Secondary | ICD-10-CM | POA: Diagnosis not present

## 2023-03-25 DIAGNOSIS — M75101 Unspecified rotator cuff tear or rupture of right shoulder, not specified as traumatic: Secondary | ICD-10-CM | POA: Diagnosis not present

## 2023-03-25 DIAGNOSIS — M25511 Pain in right shoulder: Secondary | ICD-10-CM | POA: Diagnosis not present

## 2023-04-01 ENCOUNTER — Other Ambulatory Visit (HOSPITAL_COMMUNITY)
Admission: RE | Admit: 2023-04-01 | Discharge: 2023-04-01 | Disposition: A | Discharge status: Home / Self Care | Payer: Self-pay | Source: Ambulatory Visit | Attending: Medical Genetics | Admitting: Medical Genetics

## 2023-04-01 DIAGNOSIS — Z006 Encounter for examination for normal comparison and control in clinical research program: Secondary | ICD-10-CM | POA: Insufficient documentation

## 2023-04-03 DIAGNOSIS — M75101 Unspecified rotator cuff tear or rupture of right shoulder, not specified as traumatic: Secondary | ICD-10-CM | POA: Diagnosis not present

## 2023-04-03 DIAGNOSIS — R531 Weakness: Secondary | ICD-10-CM | POA: Diagnosis not present

## 2023-04-03 DIAGNOSIS — M25511 Pain in right shoulder: Secondary | ICD-10-CM | POA: Diagnosis not present

## 2023-04-08 DIAGNOSIS — M75101 Unspecified rotator cuff tear or rupture of right shoulder, not specified as traumatic: Secondary | ICD-10-CM | POA: Diagnosis not present

## 2023-04-08 DIAGNOSIS — M25511 Pain in right shoulder: Secondary | ICD-10-CM | POA: Diagnosis not present

## 2023-04-08 DIAGNOSIS — R531 Weakness: Secondary | ICD-10-CM | POA: Diagnosis not present

## 2023-04-12 LAB — GENECONNECT MOLECULAR SCREEN: Genetic Analysis Overall Interpretation: NEGATIVE

## 2023-04-17 DIAGNOSIS — M75101 Unspecified rotator cuff tear or rupture of right shoulder, not specified as traumatic: Secondary | ICD-10-CM | POA: Diagnosis not present

## 2023-04-17 DIAGNOSIS — R531 Weakness: Secondary | ICD-10-CM | POA: Diagnosis not present

## 2023-04-17 DIAGNOSIS — M25511 Pain in right shoulder: Secondary | ICD-10-CM | POA: Diagnosis not present

## 2023-04-18 DIAGNOSIS — H11823 Conjunctivochalasis, bilateral: Secondary | ICD-10-CM | POA: Diagnosis not present

## 2023-04-18 DIAGNOSIS — H04123 Dry eye syndrome of bilateral lacrimal glands: Secondary | ICD-10-CM | POA: Diagnosis not present

## 2023-04-18 DIAGNOSIS — H2513 Age-related nuclear cataract, bilateral: Secondary | ICD-10-CM | POA: Diagnosis not present

## 2023-04-18 DIAGNOSIS — D492 Neoplasm of unspecified behavior of bone, soft tissue, and skin: Secondary | ICD-10-CM | POA: Diagnosis not present

## 2023-04-18 DIAGNOSIS — H348322 Tributary (branch) retinal vein occlusion, left eye, stable: Secondary | ICD-10-CM | POA: Diagnosis not present

## 2023-04-18 DIAGNOSIS — H0102A Squamous blepharitis right eye, upper and lower eyelids: Secondary | ICD-10-CM | POA: Diagnosis not present

## 2023-04-18 DIAGNOSIS — H35352 Cystoid macular degeneration, left eye: Secondary | ICD-10-CM | POA: Diagnosis not present

## 2023-04-18 DIAGNOSIS — H0102B Squamous blepharitis left eye, upper and lower eyelids: Secondary | ICD-10-CM | POA: Diagnosis not present

## 2023-04-18 DIAGNOSIS — H43812 Vitreous degeneration, left eye: Secondary | ICD-10-CM | POA: Diagnosis not present

## 2023-05-08 DIAGNOSIS — M25511 Pain in right shoulder: Secondary | ICD-10-CM | POA: Diagnosis not present

## 2023-05-08 DIAGNOSIS — R531 Weakness: Secondary | ICD-10-CM | POA: Diagnosis not present

## 2023-05-08 DIAGNOSIS — M75101 Unspecified rotator cuff tear or rupture of right shoulder, not specified as traumatic: Secondary | ICD-10-CM | POA: Diagnosis not present

## 2023-05-22 DIAGNOSIS — M25511 Pain in right shoulder: Secondary | ICD-10-CM | POA: Diagnosis not present

## 2023-05-22 DIAGNOSIS — M75101 Unspecified rotator cuff tear or rupture of right shoulder, not specified as traumatic: Secondary | ICD-10-CM | POA: Diagnosis not present

## 2023-05-22 DIAGNOSIS — R531 Weakness: Secondary | ICD-10-CM | POA: Diagnosis not present

## 2023-05-27 DIAGNOSIS — M25511 Pain in right shoulder: Secondary | ICD-10-CM | POA: Diagnosis not present

## 2023-06-05 DIAGNOSIS — R531 Weakness: Secondary | ICD-10-CM | POA: Diagnosis not present

## 2023-06-05 DIAGNOSIS — M25511 Pain in right shoulder: Secondary | ICD-10-CM | POA: Diagnosis not present

## 2023-06-05 DIAGNOSIS — M75101 Unspecified rotator cuff tear or rupture of right shoulder, not specified as traumatic: Secondary | ICD-10-CM | POA: Diagnosis not present

## 2023-08-15 ENCOUNTER — Encounter: Payer: Self-pay | Admitting: Sports Medicine

## 2023-08-15 ENCOUNTER — Ambulatory Visit (INDEPENDENT_AMBULATORY_CARE_PROVIDER_SITE_OTHER): Admitting: Sports Medicine

## 2023-08-15 VITALS — BP 130/86 | Ht 68.0 in | Wt 155.0 lb

## 2023-08-15 DIAGNOSIS — M79671 Pain in right foot: Secondary | ICD-10-CM | POA: Diagnosis not present

## 2023-08-15 DIAGNOSIS — M25579 Pain in unspecified ankle and joints of unspecified foot: Secondary | ICD-10-CM

## 2023-08-15 NOTE — Progress Notes (Addendum)
 PCP: Roseann Coad, MD (Inactive)  Subjective:   HPI: Patient is a 78 y.o. male here for plantar right midfoot pain.  Pain began 4 days ago in the absence of trauma or inciting event, though he notes he has been wearing flip-flops more frequently.  Pain is located along the medial portion of the plantar midfoot and intensifies as the day progresses.  He has tried icing, ibuprofen , and reduced activity, which he believes is already improving his symptoms.  He is traveling to [place] on Sunday and would like to discuss additional treatment options.  Past Medical History:  Diagnosis Date   Cancer ([facility])    basal cell on nose removed   Diverticulosis    Encounter for loop recorder check 08/04/2018   Hyperlipidemia    Loop recorder in situ: Biotroik 06/20/18 for cryptogenic stroke.  06/20/2018   Scheduled Remote loop recorder check 07/30/2018: There were 0 symptomatic patient activations recorded. No mode switches. NSR. No A. Fib.   TIA (transient ischemic attack) 04/08/2018   Tubular adenoma of colon 2016   Vascular occlusion    retinal vein occlusion    Current Outpatient Medications on File Prior to Visit  Medication Sig Dispense Refill   aspirin  81 MG tablet Take 81 mg by mouth daily.     atorvastatin  (LIPITOR) 40 MG tablet Take 1 tablet (40 mg total) by mouth daily. 30 tablet 0   Cholecalciferol (VITAMIN D3) 25 MCG (1000 UT) CAPS Take 1,000 Units by mouth daily.     Coenzyme Q10 (CO Q-10) 100 MG CAPS Take by mouth daily.     meloxicam  (MOBIC ) 15 MG tablet Take one pill a day with food for 5 days and then prn thereafter 40 tablet 2   Misc Natural Products (PROSTATE SUPPORT PO) Take by mouth daily. Easy flow     Multiple Vitamin (MULTIVITAMIN) tablet Take 1 tablet by mouth daily.     OVER THE COUNTER MEDICATION Take 1 tablet by mouth daily. Prostate plus health complex.     No current facility-administered medications on file prior to visit.    Past Surgical History:  Procedure  Laterality Date   COLONOSCOPY  04/30/2014   LOOP RECORDER IMPLANT  06/20/2018   Biotronik Loop Recorder. Serial # [id number]  for cryptogenic stroke    POLYPECTOMY     TONSILLECTOMY      Allergies  Allergen Reactions   Sulfa Antibiotics     Unknown.  Mother told me not to take    BP 130/86 (BP Location: Left Arm, Patient Position: Sitting)   Ht 5' 8 (1.727 m)   Wt 155 lb (70.3 kg)   BMI 23.57 kg/m      10/19/2020    2:01 PM 12/01/2020    9:18 AM 12/22/2020    9:19 AM  [facility]  Frequency of aerobic exercise (# of days/week) 5 5 5   Average time in minutes 40 40 40  Frequency of strengthening activities (# of days/week) 2 2 2         No data to display              Objective:  Physical Exam:  Gen: NAD, comfortable in exam room  Right foot / ankle: No gross deformity on inspection of the right foot and ankle He displays a slight hallux valgus deformity with pronation.  There is slight supination of the fifth digit. Hallux rigidus noted (15 degrees of extension and 20 degrees of flexion) Full range of motion  of the right ankle No TTP over the medial and lateral malleoli or medial calcaneus  Long arch is mildly pronated on RT/ neutral on left but low Transverse arch at plantar plate is widened and flattened bilat. R> L  Left foot shows normal range of motion at the first MTP   Assessment & Plan:  1.  Right foot pain History and exam findings today are suggestive of a longitudinal arch strain in the setting of hallux rigidus. Fortunately, pain is already improving with conservative treatment measures.  Given his upcoming travel, he was fitted for sports inserts with a scaphoid pad, which should provide increased comfort and support with walking.  He was also given arch straps, which he can stop using as pain resolves.  Follow-up as needed if symptoms worsen or fail to improve.

## 2023-08-16 DIAGNOSIS — J310 Chronic rhinitis: Secondary | ICD-10-CM | POA: Diagnosis not present

## 2023-08-16 DIAGNOSIS — E7800 Pure hypercholesterolemia, unspecified: Secondary | ICD-10-CM | POA: Diagnosis not present

## 2023-08-16 DIAGNOSIS — Z0000 Encounter for general adult medical examination without abnormal findings: Secondary | ICD-10-CM | POA: Diagnosis not present

## 2023-08-16 DIAGNOSIS — M8589 Other specified disorders of bone density and structure, multiple sites: Secondary | ICD-10-CM | POA: Diagnosis not present

## 2023-08-16 DIAGNOSIS — Z79899 Other long term (current) drug therapy: Secondary | ICD-10-CM | POA: Diagnosis not present

## 2023-08-16 DIAGNOSIS — Z1331 Encounter for screening for depression: Secondary | ICD-10-CM | POA: Diagnosis not present

## 2023-08-16 DIAGNOSIS — N400 Enlarged prostate without lower urinary tract symptoms: Secondary | ICD-10-CM | POA: Diagnosis not present

## 2023-11-06 DIAGNOSIS — R351 Nocturia: Secondary | ICD-10-CM | POA: Diagnosis not present

## 2023-11-06 DIAGNOSIS — R3914 Feeling of incomplete bladder emptying: Secondary | ICD-10-CM | POA: Diagnosis not present

## 2023-11-06 DIAGNOSIS — Z125 Encounter for screening for malignant neoplasm of prostate: Secondary | ICD-10-CM | POA: Diagnosis not present

## 2023-11-06 DIAGNOSIS — N401 Enlarged prostate with lower urinary tract symptoms: Secondary | ICD-10-CM | POA: Diagnosis not present
